# Patient Record
Sex: Male | Born: 1985 | Race: Black or African American | Hispanic: No | Marital: Single | Smoking: Former smoker
Health system: Southern US, Community
[De-identification: ages and names within clinical notes are randomized; demographics above are authoritative.]

## PROBLEM LIST (undated history)

## (undated) DIAGNOSIS — T7840XA Allergy, unspecified, initial encounter: Secondary | ICD-10-CM

## (undated) DIAGNOSIS — K219 Gastro-esophageal reflux disease without esophagitis: Secondary | ICD-10-CM

## (undated) DIAGNOSIS — I10 Essential (primary) hypertension: Secondary | ICD-10-CM

## (undated) DIAGNOSIS — I82409 Acute embolism and thrombosis of unspecified deep veins of unspecified lower extremity: Secondary | ICD-10-CM

## (undated) DIAGNOSIS — F4310 Post-traumatic stress disorder, unspecified: Secondary | ICD-10-CM

## (undated) HISTORY — DX: Acute embolism and thrombosis of unspecified deep veins of unspecified lower extremity: I82.409

## (undated) HISTORY — PX: WRIST SURGERY: SHX841

## (undated) HISTORY — DX: Allergy, unspecified, initial encounter: T78.40XA

## (undated) SURGERY — Surgical Case
Anesthesia: *Unknown

---

## 2009-08-14 ENCOUNTER — Emergency Department (HOSPITAL_COMMUNITY): Admission: EM | Admit: 2009-08-14 | Discharge: 2009-08-14 | Payer: Self-pay | Admitting: Emergency Medicine

## 2009-08-28 ENCOUNTER — Emergency Department (HOSPITAL_COMMUNITY): Admission: EM | Admit: 2009-08-28 | Discharge: 2009-08-28 | Payer: Self-pay | Admitting: Family Medicine

## 2011-06-20 ENCOUNTER — Emergency Department (HOSPITAL_COMMUNITY)
Admission: EM | Admit: 2011-06-20 | Discharge: 2011-06-20 | Disposition: A | Discharge status: Home / Self Care | Payer: Self-pay | Attending: Emergency Medicine | Admitting: Emergency Medicine

## 2011-06-20 ENCOUNTER — Emergency Department (HOSPITAL_COMMUNITY): Payer: Self-pay

## 2011-06-20 DIAGNOSIS — K219 Gastro-esophageal reflux disease without esophagitis: Secondary | ICD-10-CM | POA: Insufficient documentation

## 2011-06-20 DIAGNOSIS — R51 Headache: Secondary | ICD-10-CM | POA: Insufficient documentation

## 2011-06-20 DIAGNOSIS — R143 Flatulence: Secondary | ICD-10-CM | POA: Insufficient documentation

## 2011-06-20 DIAGNOSIS — R05 Cough: Secondary | ICD-10-CM | POA: Insufficient documentation

## 2011-06-20 DIAGNOSIS — R141 Gas pain: Secondary | ICD-10-CM | POA: Insufficient documentation

## 2011-06-20 DIAGNOSIS — R142 Eructation: Secondary | ICD-10-CM | POA: Insufficient documentation

## 2011-06-20 DIAGNOSIS — R059 Cough, unspecified: Secondary | ICD-10-CM | POA: Insufficient documentation

## 2011-06-20 DIAGNOSIS — R0789 Other chest pain: Secondary | ICD-10-CM | POA: Insufficient documentation

## 2011-08-03 ENCOUNTER — Ambulatory Visit (INDEPENDENT_AMBULATORY_CARE_PROVIDER_SITE_OTHER): Payer: Self-pay

## 2011-08-03 ENCOUNTER — Inpatient Hospital Stay (INDEPENDENT_AMBULATORY_CARE_PROVIDER_SITE_OTHER)
Admission: RE | Admit: 2011-08-03 | Discharge: 2011-08-03 | Disposition: A | Discharge status: Home / Self Care | Payer: Self-pay | Source: Ambulatory Visit | Attending: Family Medicine | Admitting: Family Medicine

## 2011-08-03 DIAGNOSIS — M79609 Pain in unspecified limb: Secondary | ICD-10-CM

## 2013-10-31 ENCOUNTER — Emergency Department (HOSPITAL_COMMUNITY): Payer: Self-pay

## 2013-10-31 ENCOUNTER — Emergency Department (HOSPITAL_COMMUNITY)
Admission: EM | Admit: 2013-10-31 | Discharge: 2013-10-31 | Discharge status: Absent without leave | Payer: Self-pay | Source: Home / Self Care

## 2013-10-31 ENCOUNTER — Emergency Department (HOSPITAL_COMMUNITY)
Admission: EM | Admit: 2013-10-31 | Discharge: 2013-10-31 | Disposition: A | Discharge status: Home / Self Care | Payer: Self-pay | Attending: Emergency Medicine | Admitting: Emergency Medicine

## 2013-10-31 ENCOUNTER — Encounter (HOSPITAL_COMMUNITY): Payer: Self-pay | Admitting: Emergency Medicine

## 2013-10-31 DIAGNOSIS — W230XXA Caught, crushed, jammed, or pinched between moving objects, initial encounter: Secondary | ICD-10-CM | POA: Insufficient documentation

## 2013-10-31 DIAGNOSIS — Y929 Unspecified place or not applicable: Secondary | ICD-10-CM | POA: Insufficient documentation

## 2013-10-31 DIAGNOSIS — Y939 Activity, unspecified: Secondary | ICD-10-CM | POA: Insufficient documentation

## 2013-10-31 DIAGNOSIS — S6991XA Unspecified injury of right wrist, hand and finger(s), initial encounter: Secondary | ICD-10-CM

## 2013-10-31 DIAGNOSIS — F17200 Nicotine dependence, unspecified, uncomplicated: Secondary | ICD-10-CM | POA: Insufficient documentation

## 2013-10-31 DIAGNOSIS — R209 Unspecified disturbances of skin sensation: Secondary | ICD-10-CM | POA: Insufficient documentation

## 2013-10-31 DIAGNOSIS — S6990XA Unspecified injury of unspecified wrist, hand and finger(s), initial encounter: Secondary | ICD-10-CM | POA: Insufficient documentation

## 2013-10-31 DIAGNOSIS — S6980XA Other specified injuries of unspecified wrist, hand and finger(s), initial encounter: Secondary | ICD-10-CM | POA: Insufficient documentation

## 2013-10-31 MED ORDER — NAPROXEN 250 MG PO TABS
500.0000 mg | ORAL_TABLET | Freq: Once | ORAL | Status: AC
  Administered 2013-10-31: 500 mg via ORAL
  Filled 2013-10-31: qty 2

## 2013-10-31 MED ORDER — TRAMADOL HCL 50 MG PO TABS: 50.0000 mg | ORAL_TABLET | Freq: Four times a day (QID) | ORAL | Status: DC | PRN

## 2013-10-31 MED ORDER — NAPROXEN 500 MG PO TABS: 500.0000 mg | ORAL_TABLET | Freq: Two times a day (BID) | ORAL | Status: DC

## 2013-10-31 NOTE — ED Notes (Addendum)
C/o R third digit pain and swelling.  Pt states he slammed it in a paper press around 1pm at work today.

## 2013-10-31 NOTE — Progress Notes (Signed)
Orthopedic Tech Progress Note Patient Details:  Jason Walter May 02, 1986 [id number]  Ortho Devices Type of Ortho Device: Finger splint Ortho Device/Splint Location: rue Ortho Device/Splint Interventions: Application   Nikki Dom 10/31/2013, 8:58 PM
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
## 2013-10-31 NOTE — ED Notes (Signed)
Pt requesting a different medication to take once he gets home, states, "The medicine did not help. I can take what she gave me during the day, but I need something stronger during the night. It is throbbing." PA made aware

## 2013-11-01 NOTE — ED Provider Notes (Signed)
Medical screening examination/treatment/procedure(s) were performed by non-physician practitioner and as supervising physician I was immediately available for consultation/collaboration.  EKG Interpretation   None        Cordarrell Sane R. Danielys Madry, MD 11/01/13 0023 

## 2014-06-04 ENCOUNTER — Encounter (HOSPITAL_COMMUNITY): Payer: Self-pay | Admitting: Emergency Medicine

## 2014-06-04 ENCOUNTER — Emergency Department (INDEPENDENT_AMBULATORY_CARE_PROVIDER_SITE_OTHER)
Admission: EM | Admit: 2014-06-04 | Discharge: 2014-06-04 | Disposition: A | Discharge status: Home / Self Care | Payer: Self-pay | Source: Home / Self Care

## 2014-06-04 DIAGNOSIS — T148XXA Other injury of unspecified body region, initial encounter: Secondary | ICD-10-CM

## 2014-06-04 DIAGNOSIS — Z041 Encounter for examination and observation following transport accident: Secondary | ICD-10-CM

## 2014-06-04 DIAGNOSIS — Z043 Encounter for examination and observation following other accident: Secondary | ICD-10-CM

## 2014-06-04 NOTE — ED Provider Notes (Signed)
Medical screening examination/treatment/procedure(s) were performed by non-physician practitioner and as supervising physician I was immediately available for consultation/collaboration.  Ashaunte Standley, M.D.  Fabien Travelstead C Yazaira Speas, MD 06/04/14 2032 
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
## 2014-06-04 NOTE — Discharge Instructions (Signed)
Ibuprofen as needed for discomfort.  Contusion A contusion is a deep bruise. Contusions are the result of an injury that caused bleeding under the skin. The contusion may turn blue, purple, or yellow. Minor injuries will give you a painless contusion, but more severe contusions may stay painful and swollen for a few weeks.  CAUSES  A contusion is usually caused by a blow, trauma, or direct force to an area of the body. SYMPTOMS   Swelling and redness of the injured area.  Bruising of the injured area.  Tenderness and soreness of the injured area.  Pain. DIAGNOSIS  The diagnosis can be made by taking a history and physical exam. An X-ray, CT scan, or MRI may be needed to determine if there were any associated injuries, such as fractures. TREATMENT  Specific treatment will depend on what area of the body was injured. In general, the best treatment for a contusion is resting, icing, elevating, and applying cold compresses to the injured area. Over-the-counter medicines may also be recommended for pain control. Ask your caregiver what the best treatment is for your contusion. HOME CARE INSTRUCTIONS   Put ice on the injured area.  Put ice in a plastic bag.  Place a towel between your skin and the bag.  Leave the ice on for 15-20 minutes, 3-4 times a day, or as directed by your health care provider.  Only take over-the-counter or prescription medicines for pain, discomfort, or fever as directed by your caregiver. Your caregiver may recommend avoiding anti-inflammatory medicines (aspirin, ibuprofen, and naproxen) for 48 hours because these medicines may increase bruising.  Rest the injured area.  If possible, elevate the injured area to reduce swelling. SEEK IMMEDIATE MEDICAL CARE IF:   You have increased bruising or swelling.  You have pain that is getting worse.  Your swelling or pain is not relieved with medicines. MAKE SURE YOU:   Understand these instructions.  Will watch  your condition.  Will get help right away if you are not doing well or get worse. Document Released: 08/25/2005 Document Revised: 11/20/2013 Document Reviewed: 09/20/2011 [facility] Patient Information 2015 [place], [place]. This information is not intended to replace advice given to you by your health care provider. Make sure you discuss any questions you have with your health care provider.

## 2014-08-06 ENCOUNTER — Emergency Department (HOSPITAL_COMMUNITY)
Admission: EM | Admit: 2014-08-06 | Discharge: 2014-08-06 | Discharge status: Against Medical Advice | Payer: Self-pay | Attending: Emergency Medicine | Admitting: Emergency Medicine

## 2014-08-06 ENCOUNTER — Encounter (HOSPITAL_COMMUNITY): Payer: Self-pay | Admitting: Emergency Medicine

## 2014-08-06 DIAGNOSIS — Y92410 Unspecified street and highway as the place of occurrence of the external cause: Secondary | ICD-10-CM | POA: Insufficient documentation

## 2014-08-06 DIAGNOSIS — F17200 Nicotine dependence, unspecified, uncomplicated: Secondary | ICD-10-CM | POA: Insufficient documentation

## 2014-08-06 DIAGNOSIS — M25512 Pain in left shoulder: Secondary | ICD-10-CM

## 2014-08-06 DIAGNOSIS — Z791 Long term (current) use of non-steroidal anti-inflammatories (NSAID): Secondary | ICD-10-CM | POA: Insufficient documentation

## 2014-08-06 DIAGNOSIS — S4980XA Other specified injuries of shoulder and upper arm, unspecified arm, initial encounter: Secondary | ICD-10-CM | POA: Insufficient documentation

## 2014-08-06 DIAGNOSIS — Y9389 Activity, other specified: Secondary | ICD-10-CM | POA: Insufficient documentation

## 2014-08-06 DIAGNOSIS — S46909A Unspecified injury of unspecified muscle, fascia and tendon at shoulder and upper arm level, unspecified arm, initial encounter: Secondary | ICD-10-CM | POA: Insufficient documentation
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
## 2014-08-06 NOTE — ED Notes (Signed)
Xray to room to take pt to xray, pt not in room.
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
## 2014-08-09 NOTE — ED Provider Notes (Signed)
Medical screening examination/treatment/procedure(s) were performed by non-physician practitioner and as supervising physician I was immediately available for consultation/collaboration.  Taro Hidrogo L Shawntavia Saunders, MD 08/09/14 0104 

## 2014-09-01 ENCOUNTER — Encounter (HOSPITAL_COMMUNITY): Payer: Self-pay | Admitting: Emergency Medicine

## 2014-09-01 ENCOUNTER — Emergency Department (HOSPITAL_COMMUNITY)
Admission: EM | Admit: 2014-09-01 | Discharge: 2014-09-02 | Disposition: A | Discharge status: Home / Self Care | Payer: No Typology Code available for payment source | Attending: Emergency Medicine | Admitting: Emergency Medicine

## 2014-09-01 DIAGNOSIS — M25511 Pain in right shoulder: Secondary | ICD-10-CM

## 2014-09-01 DIAGNOSIS — M545 Low back pain, unspecified: Secondary | ICD-10-CM

## 2014-09-01 DIAGNOSIS — S3992XA Unspecified injury of lower back, initial encounter: Secondary | ICD-10-CM | POA: Insufficient documentation

## 2014-09-01 DIAGNOSIS — Y9389 Activity, other specified: Secondary | ICD-10-CM | POA: Insufficient documentation

## 2014-09-01 DIAGNOSIS — Z791 Long term (current) use of non-steroidal anti-inflammatories (NSAID): Secondary | ICD-10-CM | POA: Insufficient documentation

## 2014-09-01 DIAGNOSIS — Y92410 Unspecified street and highway as the place of occurrence of the external cause: Secondary | ICD-10-CM | POA: Insufficient documentation

## 2014-09-01 DIAGNOSIS — Z79899 Other long term (current) drug therapy: Secondary | ICD-10-CM | POA: Insufficient documentation

## 2014-09-01 DIAGNOSIS — S4991XA Unspecified injury of right shoulder and upper arm, initial encounter: Secondary | ICD-10-CM | POA: Insufficient documentation

## 2014-09-01 DIAGNOSIS — Z720 Tobacco use: Secondary | ICD-10-CM | POA: Insufficient documentation

## 2014-09-01 MED ORDER — KETOROLAC TROMETHAMINE 60 MG/2ML IM SOLN
60.0000 mg | Freq: Once | INTRAMUSCULAR | Status: AC
  Administered 2014-09-02: 60 mg via INTRAMUSCULAR
  Filled 2014-09-01: qty 2
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
## 2014-09-02 ENCOUNTER — Emergency Department (HOSPITAL_COMMUNITY): Payer: No Typology Code available for payment source

## 2014-09-02 MED ORDER — METHOCARBAMOL 500 MG PO TABS: 500.0000 mg | ORAL_TABLET | Freq: Two times a day (BID) | ORAL | Status: DC

## 2014-09-02 MED ORDER — IBUPROFEN 800 MG PO TABS: 800.0000 mg | ORAL_TABLET | Freq: Three times a day (TID) | ORAL | Status: DC
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
## 2014-09-02 NOTE — ED Notes (Signed)
Pt returned from x-ray.  Pt remains alert and oriented x's 3.  Friend at bedside
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
## 2014-09-06 NOTE — ED Provider Notes (Signed)
Medical screening examination/treatment/procedure(s) were performed by non-physician practitioner and as supervising physician I was immediately available for consultation/collaboration.   EKG Interpretation None       Nthony Lefferts, MD 09/06/14 0102 

## 2016-06-04 ENCOUNTER — Encounter (HOSPITAL_COMMUNITY): Payer: Self-pay | Admitting: Emergency Medicine

## 2016-06-04 ENCOUNTER — Emergency Department (HOSPITAL_COMMUNITY)
Admission: EM | Admit: 2016-06-04 | Discharge: 2016-06-04 | Disposition: A | Discharge status: Home / Self Care | Payer: No Typology Code available for payment source | Attending: Emergency Medicine | Admitting: Emergency Medicine

## 2016-06-04 DIAGNOSIS — Y999 Unspecified external cause status: Secondary | ICD-10-CM | POA: Diagnosis not present

## 2016-06-04 DIAGNOSIS — F17200 Nicotine dependence, unspecified, uncomplicated: Secondary | ICD-10-CM | POA: Insufficient documentation

## 2016-06-04 DIAGNOSIS — Y92410 Unspecified street and highway as the place of occurrence of the external cause: Secondary | ICD-10-CM | POA: Insufficient documentation

## 2016-06-04 DIAGNOSIS — Y939 Activity, unspecified: Secondary | ICD-10-CM | POA: Diagnosis not present

## 2016-06-04 DIAGNOSIS — M545 Low back pain: Secondary | ICD-10-CM

## 2016-06-04 MED ORDER — NAPROXEN 250 MG PO TABS: 250.0000 mg | ORAL_TABLET | Freq: Two times a day (BID) | ORAL | Status: DC
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
## 2016-07-21 ENCOUNTER — Ambulatory Visit (HOSPITAL_COMMUNITY)
Admission: EM | Admit: 2016-07-21 | Discharge: 2016-07-21 | Disposition: A | Discharge status: Home / Self Care | Payer: Self-pay | Attending: Family Medicine | Admitting: Family Medicine

## 2016-07-21 ENCOUNTER — Encounter (HOSPITAL_COMMUNITY): Payer: Self-pay | Admitting: Emergency Medicine

## 2016-07-21 DIAGNOSIS — R300 Dysuria: Secondary | ICD-10-CM | POA: Insufficient documentation

## 2016-07-21 DIAGNOSIS — F17210 Nicotine dependence, cigarettes, uncomplicated: Secondary | ICD-10-CM | POA: Insufficient documentation

## 2016-07-21 DIAGNOSIS — R369 Urethral discharge, unspecified: Secondary | ICD-10-CM | POA: Insufficient documentation

## 2016-07-21 DIAGNOSIS — Z711 Person with feared health complaint in whom no diagnosis is made: Secondary | ICD-10-CM

## 2016-07-21 DIAGNOSIS — Z202 Contact with and (suspected) exposure to infections with a predominantly sexual mode of transmission: Secondary | ICD-10-CM

## 2016-07-21 NOTE — ED Provider Notes (Signed)
CSN: [id number]     Arrival date & time 07/21/16  [id number] History   None    Chief Complaint  Patient presents with  . SEXUALLY TRANSMITTED DISEASE   (Consider location/radiation/quality/duration/timing/severity/associated sxs/prior Treatment) HPI 30 year old male intercourse with a new male approximately one half weeks ago now has a milky   White discharge from his penis. He also has dysuria with urination. No previous history of STD. No pain at this time. No home treatment. Although patient did state he looked on the Internet and was going to try a vinegar cleansing. History reviewed. No pertinent past medical history. History reviewed. No pertinent surgical history. History reviewed. No pertinent family history. Social History  Substance Use Topics  . Smoking status: Current Every Day Smoker    Packs/day: 0.10  . Smokeless tobacco: Never Used  . Alcohol use Yes     Comment: occasional    Review of Systems  Denies: HEADACHE, NAUSEA, ABDOMINAL PAIN, CHEST PAIN, CONGESTION,  SHORTNESS OF BREATH  Allergies  Review of patient's allergies indicates no known allergies.  Home Medications   Prior to Admission medications   Medication Sig Start Date End Date Taking? Authorizing Provider  ibuprofen (ADVIL,MOTRIN) 800 MG tablet Take 1 tablet (800 mg total) by mouth 3 (three) times daily. 09/02/14   Hannah Muthersbaugh, PA-C  methocarbamol (ROBAXIN) 500 MG tablet Take 1 tablet (500 mg total) by mouth 2 (two) times daily. 09/02/14   Hannah Muthersbaugh, PA-C  naproxen (NAPROSYN) 250 MG tablet Take 1 tablet (250 mg total) by mouth 2 (two) times daily with a meal. 06/04/16   Everlene FarrierWilliam Dansie, PA-C   Meds Ordered and Administered this Visit  Medications - No data to display  BP (!) 148/103 (BP Location: Right Arm)   Pulse 115   Temp 98.8 F (37.1 C) (Oral)   SpO2 100%  No data found.   Physical Exam NURSES NOTES AND VITAL SIGNS REVIEWED. CONSTITUTIONAL: Well developed, well nourished, no  acute distress HEENT: normocephalic, atraumatic EYES: Conjunctiva normal NECK:normal ROM, supple, no adenopathy PULMONARY:No respiratory distress, normal effort ABDOMINAL: Soft, ND, NT BS+, No CVAT MUSCULOSKELETAL: Normal ROM of all extremities,  SKIN: warm and dry without rash PSYCHIATRIC: Mood and affect, behavior are normal GENITALIA: NORMAL EXTERNAL MALE PENIS AND SCROTUM.  NO VISIBLE OR PALPABLE LESIONS.   Urgent Care Course   Clinical Course    Procedures (including critical care time)  Labs Review Labs Reviewed  URINE CYTOLOGY ANCILLARY ONLY   Tests are pending Imaging Review No results found.   Visual Acuity Review  Right Eye Distance:   Left Eye Distance:   Bilateral Distance:    Right Eye Near:   Left Eye Near:    Bilateral Near:         30 year old male with concerns of possible STD. Cultures are pending patient declines HIV because of cost.   As far as been under cleansing I advised patient that he can proceed with this if he so desires but if it turns positive on his STD cultures he will need to be treated per the rules of the public health department. MDM   1. Concern about STD in male without diagnosis     Patient is reassured that there are no issues that require transfer to higher level of care at this time or additional tests. Patient is advised to continue home symptomatic treatment. Patient is advised that if there are new or worsening symptoms to attend the emergency department, contact primary care provider, or  return to UC. Instructions of care provided discharged home in stable condition.    THIS NOTE WAS GENERATED USING A VOICE RECOGNITION SOFTWARE PROGRAM. ALL REASONABLE EFFORTS  WERE MADE TO PROOFREAD THIS DOCUMENT FOR ACCURACY.  I have verbally reviewed the discharge instructions with the patient. A printed AVS was given to the patient.  All questions were answered prior to discharge.      Tharon AquasFrank C Tashona Calk, PA 07/21/16 1144

## 2016-07-21 NOTE — ED Triage Notes (Signed)
Pt reports burning with urination and some white discharge for the last week.  Pt has had unprotected sex in the recent past.

## 2016-07-22 LAB — URINE CYTOLOGY ANCILLARY ONLY
CHLAMYDIA, DNA PROBE: POSITIVE — AB
Neisseria Gonorrhea: NEGATIVE

## 2016-07-23 ENCOUNTER — Telehealth (HOSPITAL_COMMUNITY): Payer: Self-pay | Admitting: Internal Medicine

## 2016-07-23 MED ORDER — AZITHROMYCIN 500 MG PO TABS: 1000.0000 mg | ORAL_TABLET | Freq: Once | ORAL | 0 refills | Status: AC
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
## 2016-07-28 NOTE — Telephone Encounter (Signed)
See lab note from 8/23

## 2016-09-17 ENCOUNTER — Ambulatory Visit (HOSPITAL_COMMUNITY)
Admission: EM | Admit: 2016-09-17 | Discharge: 2016-09-17 | Disposition: A | Discharge status: Home / Self Care | Payer: Self-pay | Attending: Family Medicine | Admitting: Family Medicine

## 2016-09-17 ENCOUNTER — Encounter (HOSPITAL_COMMUNITY): Payer: Self-pay | Admitting: Family Medicine

## 2016-09-17 DIAGNOSIS — N342 Other urethritis: Secondary | ICD-10-CM | POA: Insufficient documentation

## 2016-09-17 DIAGNOSIS — Z202 Contact with and (suspected) exposure to infections with a predominantly sexual mode of transmission: Secondary | ICD-10-CM | POA: Insufficient documentation

## 2016-09-17 DIAGNOSIS — F17210 Nicotine dependence, cigarettes, uncomplicated: Secondary | ICD-10-CM | POA: Insufficient documentation

## 2016-09-17 MED ORDER — AZITHROMYCIN 250 MG PO TABS
ORAL_TABLET | ORAL | Status: AC
  Filled 2016-09-17: qty 4

## 2016-09-17 MED ORDER — CEFTRIAXONE SODIUM 250 MG IJ SOLR
250.0000 mg | Freq: Once | INTRAMUSCULAR | Status: AC
  Administered 2016-09-17: 250 mg via INTRAMUSCULAR

## 2016-09-17 MED ORDER — LIDOCAINE HCL (PF) 1 % IJ SOLN
INTRAMUSCULAR | Status: AC
  Filled 2016-09-17: qty 2

## 2016-09-17 MED ORDER — CEFTRIAXONE SODIUM 250 MG IJ SOLR
INTRAMUSCULAR | Status: AC
  Filled 2016-09-17: qty 250

## 2016-09-17 MED ORDER — AZITHROMYCIN 250 MG PO TABS
1000.0000 mg | ORAL_TABLET | Freq: Once | ORAL | Status: AC
  Administered 2016-09-17: 1000 mg via ORAL

## 2016-09-17 NOTE — ED Notes (Signed)
Call back number verified and updated in EPIC... Adv pt to not have SI until lab results comeback neg.... Also adv pt lab results will be on MyChart; instructions given .... Pt verb understanding.   

## 2016-09-17 NOTE — ED Triage Notes (Signed)
Pt here for penile dc onset 2 weeks  Reports he was pos for Chlam and was given azithromycin but not sure if took them correctly  Denies exposure to STDs today, denies urinary sx, fevers, chills... Just wants to make sure he doesn't have Chlam  A&O x4... NAD

## 2016-09-17 NOTE — ED Provider Notes (Signed)
[facility]    CSN: [id number] Arrival date & time: 09/17/16  1703     History   Chief Complaint Chief Complaint  Patient presents with  . Exposure to STD    HPI Jason Walter is a 30 y.o. male.   This is 4 year old man was last seen in August and treated for STD. He says he never got completely better and they still has penile discharge. He can't remember if he took the medicine as prescribed.  He has no joint pain or fever.      History reviewed. No pertinent past medical history.  There are no active problems to display for this patient.   History reviewed. No pertinent surgical history.     Home Medications    Prior to Admission medications   Medication Sig Start Date End Date Taking? Authorizing Provider  ibuprofen (ADVIL,MOTRIN) 800 MG tablet Take 1 tablet (800 mg total) by mouth 3 (three) times daily. 09/02/14   Hannah Muthersbaugh, PA-C  methocarbamol (ROBAXIN) 500 MG tablet Take 1 tablet (500 mg total) by mouth 2 (two) times daily. 09/02/14   Hannah Muthersbaugh, PA-C  naproxen (NAPROSYN) 250 MG tablet Take 1 tablet (250 mg total) by mouth 2 (two) times daily with a meal. 06/04/16   Everlene Farrier, PA-C    Family History History reviewed. No pertinent family history.  Social History Social History  Substance Use Topics  . Smoking status: Current Every Day Smoker    Packs/day: 0.10  . Smokeless tobacco: Never Used  . Alcohol use Yes     Comment: occasional     Allergies   Review of patient's allergies indicates no known allergies.   Review of Systems Review of Systems  Constitutional: Negative.   HENT: Negative.   Genitourinary: Positive for discharge.  Musculoskeletal: Negative.      Physical Exam Triage Vital Signs ED Triage Vitals [09/17/16 1707]  Enc Vitals Group     BP 135/67     Pulse Rate 115     Resp 14     Temp 98.9 F (37.2 C)     Temp Source Oral     SpO2 100 %     Weight      Height      Head  Circumference      Peak Flow      Pain Score      Pain Loc      Pain Edu?      Excl. in GC?    No data found.   Updated Vital Signs BP 135/67 (BP Location: Left Arm)   Pulse 115   Temp 98.9 F (37.2 C) (Oral)   Resp 14   SpO2 100%      Physical Exam  Constitutional: He is oriented to person, place, and time. He appears well-developed and well-nourished.  HENT:  Head: Normocephalic.  Eyes: Conjunctivae are normal. Pupils are equal, round, and reactive to light.  Neck: Normal range of motion. Neck supple.  Pulmonary/Chest: Effort normal.  Abdominal: Soft.  Genitourinary: Penis normal.  Neurological: He is alert and oriented to person, place, and time.  Skin: Skin is warm and dry.  Nursing note and vitals reviewed.  Penis:  Normal with scant watery discharge Genitalia otherwise normal  UC Treatments / Results  Labs (all labs ordered are listed, but only abnormal results are displayed) Labs Reviewed  URINE CYTOLOGY ANCILLARY ONLY    EKG  EKG Interpretation None       Radiology No  results found.  Procedures Procedures (including critical care time)  Medications Ordered in UC Medications  cefTRIAXone (ROCEPHIN) injection 250 mg (250 mg Intramuscular Given 09/17/16 1747)  azithromycin (ZITHROMAX) tablet 1,000 mg (1,000 mg Oral Given 09/17/16 1748)     Initial Impression / Assessment and Plan / UC Course  I have reviewed the triage vital signs and the nursing notes.  Pertinent labs & imaging results that were available during my care of the patient were reviewed by me and considered in my medical decision making (see chart for details).  Clinical Course    Final Clinical Impressions(s) / UC Diagnoses   Final diagnoses:  Urethritis  STD exposure    New Prescriptions Discharge Medication List as of 09/17/2016  5:37 PM    Rocephin 250 mg IM Azithromycin 1 g by mouth   Elvina SidleKurt Paw Karstens, MD 09/20/16 1258

## 2016-09-20 LAB — URINE CYTOLOGY ANCILLARY ONLY
Chlamydia: POSITIVE — AB
Neisseria Gonorrhea: NEGATIVE

## 2016-09-21 ENCOUNTER — Telehealth (HOSPITAL_COMMUNITY): Payer: Self-pay | Admitting: Emergency Medicine

## 2016-09-21 NOTE — Telephone Encounter (Signed)
-----   Message from Eustace MooreLaura W Murray, MD sent at 09/20/2016  5:24 PM EDT ----- Please let patient and health department know that test for chlamydia was positive.   [facility] 1[facility].   Sexual partners need to be notified and tested/treated.   Recheck for further evaluation if symptoms persist.  LM

## 2016-09-21 NOTE — Telephone Encounter (Signed)
Called [phone]; no answer or VM Need to give lab results and to see how pt is doing from recent visit on 09/17/16 [facility]

## 2016-09-29 NOTE — Telephone Encounter (Signed)
LM on [phone]; Called [phone] but it was busy Mailed letter as 4th attempt Need to give lab results and to see how pt is doing from recent visit on 10/20 Also let pt know labs can be obtained from MyChart

## 2017-08-31 ENCOUNTER — Ambulatory Visit (HOSPITAL_COMMUNITY)
Admission: EM | Admit: 2017-08-31 | Discharge: 2017-08-31 | Disposition: A | Discharge status: Home / Self Care | Payer: Self-pay | Attending: Family Medicine | Admitting: Family Medicine

## 2017-08-31 ENCOUNTER — Encounter (HOSPITAL_COMMUNITY): Payer: Self-pay | Admitting: Emergency Medicine

## 2017-08-31 ENCOUNTER — Ambulatory Visit (INDEPENDENT_AMBULATORY_CARE_PROVIDER_SITE_OTHER): Payer: Self-pay

## 2017-08-31 DIAGNOSIS — M25532 Pain in left wrist: Secondary | ICD-10-CM
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
## 2018-08-15 ENCOUNTER — Ambulatory Visit (HOSPITAL_COMMUNITY)
Admission: EM | Admit: 2018-08-15 | Discharge: 2018-08-15 | Disposition: A | Discharge status: Home / Self Care | Payer: Self-pay | Attending: Family Medicine | Admitting: Family Medicine

## 2018-08-15 ENCOUNTER — Other Ambulatory Visit: Payer: Self-pay

## 2018-08-15 ENCOUNTER — Encounter (HOSPITAL_COMMUNITY): Payer: Self-pay | Admitting: Emergency Medicine

## 2018-08-15 DIAGNOSIS — M25571 Pain in right ankle and joints of right foot: Secondary | ICD-10-CM

## 2018-08-15 DIAGNOSIS — M545 Low back pain, unspecified: Secondary | ICD-10-CM

## 2018-08-15 MED ORDER — CYCLOBENZAPRINE HCL 10 MG PO TABS: 10.0000 mg | ORAL_TABLET | Freq: Two times a day (BID) | ORAL | 0 refills | Status: DC | PRN

## 2018-08-15 MED ORDER — IBUPROFEN 800 MG PO TABS: 800.0000 mg | ORAL_TABLET | Freq: Three times a day (TID) | ORAL | 0 refills | Status: DC
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
## 2018-08-15 NOTE — Discharge Instructions (Signed)
I expect pain to gradually improve over the next 1 to 2 weeks from here  Use anti-inflammatories for pain/swelling. You may take up to 800 mg Ibuprofen every 8 hours with food. You may supplement Ibuprofen with Tylenol [phone] mg every 8 hours.   You may use flexeril as needed to help with pain. This is a muscle relaxer and causes sedation- please use only at bedtime or when you will be home and not have to drive/work- may begin with 1/2 tablet  Alternate ice and heat to help with discomfort  Please follow-up if pain worsening, developing numbness or tingling, changes in bowel movements
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
## 2018-09-08 ENCOUNTER — Encounter (HOSPITAL_COMMUNITY): Payer: Self-pay | Admitting: Emergency Medicine

## 2018-09-08 ENCOUNTER — Ambulatory Visit (HOSPITAL_COMMUNITY)
Admission: EM | Admit: 2018-09-08 | Discharge: 2018-09-08 | Disposition: A | Discharge status: Home / Self Care | Payer: Self-pay | Attending: Family Medicine | Admitting: Family Medicine

## 2018-09-08 DIAGNOSIS — R319 Hematuria, unspecified: Secondary | ICD-10-CM | POA: Insufficient documentation

## 2018-09-08 DIAGNOSIS — F17210 Nicotine dependence, cigarettes, uncomplicated: Secondary | ICD-10-CM | POA: Insufficient documentation

## 2018-09-08 DIAGNOSIS — G4700 Insomnia, unspecified: Secondary | ICD-10-CM | POA: Insufficient documentation

## 2018-09-08 LAB — POCT URINALYSIS DIP (DEVICE)
BILIRUBIN URINE: NEGATIVE
Glucose, UA: NEGATIVE mg/dL
Ketones, ur: NEGATIVE mg/dL
LEUKOCYTES UA: NEGATIVE
NITRITE: NEGATIVE
PH: 6.5 (ref 5.0–8.0)
Protein, ur: NEGATIVE mg/dL
Specific Gravity, Urine: 1.02 (ref 1.005–1.030)
Urobilinogen, UA: 0.2 mg/dL (ref 0.0–1.0)

## 2018-09-08 MED ORDER — DOXYCYCLINE HYCLATE 100 MG PO TABS: 100.0000 mg | ORAL_TABLET | Freq: Two times a day (BID) | ORAL | 0 refills | Status: DC

## 2018-09-08 NOTE — ED Triage Notes (Signed)
Pt here after noting blood in urine last night

## 2018-09-08 NOTE — Discharge Instructions (Signed)
Melatonin 2 mg or up to 5 mg taken before bedtime will help with insomnia.  Please return in 1 week so I can check the urine and make sure all of the blood has cleared.

## 2018-09-08 NOTE — ED Provider Notes (Signed)
[facility]    CSN: [id number] Arrival date & time: 09/08/18  1939     History   Chief Complaint Chief Complaint  Patient presents with  . Hematuria    HPI Jason Walter is a 32 y.o. male.   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  He comes in complaining about hematuria.  This began last night.  Patient had no fever, flank pain, or significant dysuria.  He has had no discharge.  Patient also notes a small pimple on the right scrotum which he manipulated and thought might have caused the bleeding from his penis.  Patient has a history of chlamydia     History reviewed. No pertinent past medical history.  There are no active problems to display for this patient.   History reviewed. No pertinent surgical history.     Home Medications    Prior to Admission medications   Medication Sig Start Date End Date Taking? Authorizing Provider  doxycycline (VIBRA-TABS) 100 MG tablet Take 1 tablet (100 mg total) by mouth 2 (two) times daily. 09/08/18   Elvina Sidle, MD    Family History History reviewed. No pertinent family history.  Social History Social History   Tobacco Use  . Smoking status: Current Every Day Smoker    Packs/day: 0.10  . Smokeless tobacco: Never Used  Substance Use Topics  . Alcohol use: Yes    Comment: occasional  . Drug use: No     Allergies   Patient has no known allergies.   Review of Systems Review of Systems  Genitourinary: Positive for genital sores and hematuria.  Skin: Positive for rash.  All other systems reviewed and are negative.    Physical Exam Triage Vital Signs ED Triage Vitals [09/08/18 1955]  Enc Vitals Group     BP (!) 157/87     Pulse Rate 73     Resp 18     Temp 97.9 F (36.6 C)     Temp Source Oral     SpO2 100 %     Weight      Height      Head Circumference      Peak Flow      Pain Score 0     Pain Loc      Pain Edu?      Excl. in GC?    No  data found.  Updated Vital Signs BP (!) 157/87 (BP Location: Left Arm)   Pulse 73   Temp 97.9 F (36.6 C) (Oral)   Resp 18   SpO2 100%    Physical Exam  Constitutional: He is oriented to person, place, and time. He appears well-developed and well-nourished.  HENT:  Right Ear: External ear normal.  Left Ear: External ear normal.  Mouth/Throat: Oropharynx is clear and moist.  Eyes: Pupils are equal, round, and reactive to light. Conjunctivae are normal.  Neck: Normal range of motion. Neck supple.  Pulmonary/Chest: Effort normal.  Genitourinary:  Genitourinary Comments: Small drop of bright red blood seen in urethral meatus  Musculoskeletal: Normal range of motion.  Neurological: He is alert and oriented to person, place, and time.  Skin: Skin is warm and dry.  Nursing note and vitals reviewed.    UC Treatments / Results  Labs (all labs ordered are listed, but only abnormal results are displayed) Labs Reviewed  POCT URINALYSIS DIP (DEVICE) - Abnormal; Notable for the following components:      Result Value  Hgb urine dipstick LARGE (*)    All other components within normal limits  URINE CULTURE  URINE CYTOLOGY ANCILLARY ONLY    EKG None  Radiology No results found.  Procedures Procedures (including critical care time)  Medications Ordered in UC Medications - No data to display  Initial Impression / Assessment and Plan / UC Course  I have reviewed the triage vital signs and the nursing notes.  Pertinent labs & imaging results that were available during my care of the patient were reviewed by me and considered in my medical decision making (see chart for details).     Final Clinical Impressions(s) / UC Diagnoses   Final diagnoses:  Hematuria, unspecified type  Insomnia, unspecified type     Discharge Instructions     Melatonin 2 mg or up to 5 mg taken before bedtime will help with insomnia.  Please return in 1 week so I can check the urine and make  sure all of the blood has cleared.    ED Prescriptions    Medication Sig Dispense Auth. Provider   doxycycline (VIBRA-TABS) 100 MG tablet Take 1 tablet (100 mg total) by mouth 2 (two) times daily. 20 tablet Elvina Sidle, MD     Controlled Substance Prescriptions [place] Controlled Substance Registry consulted? Not Applicable   Elvina Sidle, MD 09/08/18 2052

## 2018-09-10 LAB — URINE CULTURE: Culture: <10000 — AB

## 2018-09-11 LAB — URINE CYTOLOGY ANCILLARY ONLY
Chlamydia: NEGATIVE
Neisseria Gonorrhea: NEGATIVE
Trichomonas: NEGATIVE

## 2018-12-20 ENCOUNTER — Encounter (HOSPITAL_COMMUNITY): Payer: Self-pay | Admitting: Emergency Medicine

## 2018-12-20 ENCOUNTER — Ambulatory Visit (HOSPITAL_COMMUNITY)
Admission: EM | Admit: 2018-12-20 | Discharge: 2018-12-20 | Disposition: A | Discharge status: Home / Self Care | Payer: 59 | Attending: Family Medicine | Admitting: Family Medicine

## 2018-12-20 DIAGNOSIS — S41112A Laceration without foreign body of left upper arm, initial encounter: Secondary | ICD-10-CM

## 2018-12-20 DIAGNOSIS — W260XXA Contact with knife, initial encounter: Secondary | ICD-10-CM | POA: Diagnosis not present

## 2018-12-20 DIAGNOSIS — W01198A Fall on same level from slipping, tripping and stumbling with subsequent striking against other object, initial encounter: Secondary | ICD-10-CM

## 2018-12-20 DIAGNOSIS — Z23 Encounter for immunization: Secondary | ICD-10-CM | POA: Diagnosis not present

## 2018-12-20 MED ORDER — TETANUS-DIPHTH-ACELL PERTUSSIS 5-2.5-18.5 LF-MCG/0.5 IM SUSP
INTRAMUSCULAR | Status: AC
  Filled 2018-12-20: qty 0.5

## 2018-12-20 MED ORDER — TETANUS-DIPHTH-ACELL PERTUSSIS 5-2.5-18.5 LF-MCG/0.5 IM SUSP
0.5000 mL | Freq: Once | INTRAMUSCULAR | Status: AC
  Administered 2018-12-20: 0.5 mL via INTRAMUSCULAR

## 2018-12-20 NOTE — ED Triage Notes (Signed)
Pt presents to [facility] for assessment after "falling on a knife" in his kitchen.

## 2018-12-21 NOTE — ED Provider Notes (Signed)
[facility]   [id number] 12/20/18 Arrival Time: 1920  ASSESSMENT & PLAN:  1. Arm laceration, left, initial encounter    Meds ordered this encounter  Medications  . Tdap (BOOSTRIX) injection 0.5 mL   Procedure: Verbal consent obtained. Patient provided with risks and alternatives to the procedure. Wound copiously irrigated with NS then cleansed with betadine. Anesthetized with 4 mL of lidocaine without epinephrine. Wound carefully explored. No foreign body or nonviable tissue noted. Using sterile technique #4 interrupted 4-0 Ethilon sutures were placed to reapproximate the wound. Patient tolerated procedure well. No complications. Minimal bleeding. Patient advised to look for and return for any signs of infection such as redness, swelling, discharge, or worsening pain. Return for suture removal in 7-10 days.  See AVS for written instructions. OTC analgesics if needed.  Reviewed expectations re: course of current medical issues. Questions answered. Outlined signs and symptoms indicating need for more acute intervention. Patient verbalized understanding. After Visit Summary given.   SUBJECTIVE:  Jason Walter is a 33 y.o. male who presents with a laceration of his L upper arm. Reports slipping in kitchen, knocking a knife off the counter, and falling to the floor where the knife cut his upper arm. Moderate bleeding now controlled. No extremity sensation changes or weakness. No ROM trouble of LUE.  Td UTD: No.  ROS: As per HPI.   OBJECTIVE:  Vitals:   12/20/18 1936  BP: (!) 143/67  Pulse: 98  Resp: 18  Temp: (!) 97.1 F (36.2 C)  TempSrc: Oral  SpO2: 99%    General appearance: alert; no distress Skin: linear laceration of L upper arm; size: approx 1.5 cm; clean wound edges, no foreign bodies; without active bleeding; normal distal sensation LUE: normal ROM and strength Psychological: alert and cooperative; normal mood and affect   Labs Reviewed - No data to  display  No results found.  No Known Allergies   Social History   Socioeconomic History  . Marital status: Legally Separated    Spouse name: Not on file  . Number of children: Not on file  . Years of education: Not on file  . Highest education level: Not on file  Occupational History  . Not on file  Social Needs  . Financial resource strain: Not on file  . Food insecurity:    Worry: Not on file    Inability: Not on file  . Transportation needs:    Medical: Not on file    Non-medical: Not on file  Tobacco Use  . Smoking status: Current Every Day Smoker    Packs/day: 0.10  . Smokeless tobacco: Never Used  Substance and Sexual Activity  . Alcohol use: Yes    Comment: occasional  . Drug use: No  . Sexual activity: Yes    Birth control/protection: None  Lifestyle  . Physical activity:    Days per week: Not on file    Minutes per session: Not on file  . Stress: Not on file  Relationships  . Social connections:    Talks on phone: Not on file    Gets together: Not on file    Attends religious service: Not on file    Active member of club or organization: Not on file    Attends meetings of clubs or organizations: Not on file    Relationship status: Not on file  Other Topics Concern  . Not on file  Social History Narrative  . Not on file  Mardella Layman, MD 12/26/18 [phone]

## 2019-02-13 ENCOUNTER — Emergency Department (HOSPITAL_COMMUNITY): Payer: 59

## 2019-02-13 ENCOUNTER — Emergency Department (HOSPITAL_COMMUNITY)
Admission: EM | Admit: 2019-02-13 | Discharge: 2019-02-13 | Disposition: A | Discharge status: Home / Self Care | Payer: 59 | Attending: Emergency Medicine | Admitting: Emergency Medicine

## 2019-02-13 DIAGNOSIS — Z79899 Other long term (current) drug therapy: Secondary | ICD-10-CM | POA: Insufficient documentation

## 2019-02-13 DIAGNOSIS — F17210 Nicotine dependence, cigarettes, uncomplicated: Secondary | ICD-10-CM | POA: Insufficient documentation

## 2019-02-13 DIAGNOSIS — R0789 Other chest pain: Secondary | ICD-10-CM | POA: Diagnosis present

## 2019-02-13 MED ORDER — IBUPROFEN 800 MG PO TABS
800.0000 mg | ORAL_TABLET | Freq: Once | ORAL | Status: AC
  Administered 2019-02-13: 800 mg via ORAL
  Filled 2019-02-13: qty 1

## 2019-02-13 MED ORDER — IBUPROFEN 800 MG PO TABS: 800.0000 mg | ORAL_TABLET | Freq: Three times a day (TID) | ORAL | 0 refills | Status: DC
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
## 2019-02-13 NOTE — Discharge Instructions (Addendum)
Cold compresses to chest wall for 20 minutes at a time.  Take Motrin as needed as directed.  X-rays are reassuring, no obvious rib fractures.

## 2019-02-15 ENCOUNTER — Encounter (HOSPITAL_COMMUNITY): Payer: Self-pay | Admitting: Emergency Medicine

## 2019-02-15 ENCOUNTER — Ambulatory Visit (HOSPITAL_COMMUNITY)
Admission: EM | Admit: 2019-02-15 | Discharge: 2019-02-15 | Disposition: A | Discharge status: Home / Self Care | Payer: 59 | Attending: Family Medicine | Admitting: Family Medicine

## 2019-02-15 ENCOUNTER — Other Ambulatory Visit: Payer: Self-pay

## 2019-02-15 DIAGNOSIS — R0782 Intercostal pain: Secondary | ICD-10-CM | POA: Diagnosis not present

## 2019-02-15 DIAGNOSIS — S20211A Contusion of right front wall of thorax, initial encounter: Secondary | ICD-10-CM

## 2019-02-15 MED ORDER — HYDROCODONE-ACETAMINOPHEN 5-325 MG PO TABS: 1.0000 | ORAL_TABLET | Freq: Four times a day (QID) | ORAL | 0 refills | Status: DC | PRN
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
## 2019-02-15 NOTE — ED Notes (Signed)
Pt given IS to take home.  Pt instructed on use and how often.  Pt returned demonstration and stated understanding.
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
## 2019-02-15 NOTE — Discharge Instructions (Signed)
Be aware, pain medications may cause drowsiness. Please do not drive, operate heavy machinery or make important decisions while on this medication, it can cloud your judgement.  

## 2020-05-29 ENCOUNTER — Emergency Department (HOSPITAL_COMMUNITY): Payer: No Typology Code available for payment source

## 2020-05-29 ENCOUNTER — Other Ambulatory Visit: Payer: Self-pay

## 2020-05-29 ENCOUNTER — Inpatient Hospital Stay (HOSPITAL_COMMUNITY)
Admission: EM | Admit: 2020-05-29 | Discharge: 2020-06-03 | DRG: 516 | Disposition: A | Discharge status: Home / Self Care | Payer: No Typology Code available for payment source | Attending: Physician Assistant | Admitting: Physician Assistant

## 2020-05-29 DIAGNOSIS — S7401XA Injury of sciatic nerve at hip and thigh level, right leg, initial encounter: Secondary | ICD-10-CM

## 2020-05-29 DIAGNOSIS — S32401A Unspecified fracture of right acetabulum, initial encounter for closed fracture: Secondary | ICD-10-CM

## 2020-05-29 DIAGNOSIS — Z419 Encounter for procedure for purposes other than remedying health state, unspecified: Secondary | ICD-10-CM

## 2020-05-29 DIAGNOSIS — Z6836 Body mass index (BMI) 36.0-36.9, adult: Secondary | ICD-10-CM

## 2020-05-29 DIAGNOSIS — Y92410 Unspecified street and highway as the place of occurrence of the external cause: Secondary | ICD-10-CM

## 2020-05-29 DIAGNOSIS — S32038A Other fracture of third lumbar vertebra, initial encounter for closed fracture: Secondary | ICD-10-CM | POA: Diagnosis present

## 2020-05-29 DIAGNOSIS — F17210 Nicotine dependence, cigarettes, uncomplicated: Secondary | ICD-10-CM | POA: Diagnosis present

## 2020-05-29 DIAGNOSIS — Z7289 Other problems related to lifestyle: Secondary | ICD-10-CM

## 2020-05-29 DIAGNOSIS — S32409A Unspecified fracture of unspecified acetabulum, initial encounter for closed fracture: Secondary | ICD-10-CM

## 2020-05-29 DIAGNOSIS — S32461A Displaced associated transverse-posterior fracture of right acetabulum, initial encounter for closed fracture: Secondary | ICD-10-CM | POA: Diagnosis not present

## 2020-05-29 DIAGNOSIS — M25561 Pain in right knee: Secondary | ICD-10-CM | POA: Diagnosis present

## 2020-05-29 DIAGNOSIS — K5900 Constipation, unspecified: Secondary | ICD-10-CM | POA: Diagnosis not present

## 2020-05-29 DIAGNOSIS — S73014A Posterior dislocation of right hip, initial encounter: Secondary | ICD-10-CM | POA: Diagnosis present

## 2020-05-29 DIAGNOSIS — G589 Mononeuropathy, unspecified: Secondary | ICD-10-CM | POA: Diagnosis present

## 2020-05-29 DIAGNOSIS — Z79899 Other long term (current) drug therapy: Secondary | ICD-10-CM

## 2020-05-29 DIAGNOSIS — T1490XA Injury, unspecified, initial encounter: Secondary | ICD-10-CM

## 2020-05-29 DIAGNOSIS — E669 Obesity, unspecified: Secondary | ICD-10-CM | POA: Diagnosis present

## 2020-05-29 DIAGNOSIS — D62 Acute posthemorrhagic anemia: Secondary | ICD-10-CM | POA: Diagnosis not present

## 2020-05-29 DIAGNOSIS — Z20822 Contact with and (suspected) exposure to covid-19: Secondary | ICD-10-CM | POA: Diagnosis present

## 2020-05-29 DIAGNOSIS — R000 Tachycardia, unspecified: Secondary | ICD-10-CM | POA: Diagnosis not present

## 2020-05-29 DIAGNOSIS — S73004A Unspecified dislocation of right hip, initial encounter: Secondary | ICD-10-CM

## 2020-05-29 LAB — PROTIME-INR
INR: 1 (ref 0.8–1.2)
Prothrombin Time: 12.2 seconds (ref 11.4–15.2)

## 2020-05-29 LAB — LACTIC ACID, PLASMA: Lactic Acid, Venous: 2.7 mmol/L (ref 0.5–1.9)

## 2020-05-29 LAB — CBC WITH DIFFERENTIAL/PLATELET
Abs Immature Granulocytes: 0.1 10*3/uL — ABNORMAL HIGH (ref 0.00–0.07)
Basophils Absolute: 0.1 10*3/uL (ref 0.0–0.1)
Basophils Relative: 1 %
Eosinophils Absolute: 0.1 10*3/uL (ref 0.0–0.5)
Eosinophils Relative: 1 %
HCT: 44 % (ref 39.0–52.0)
Hemoglobin: 14.4 g/dL (ref 13.0–17.0)
Immature Granulocytes: 1 %
Lymphocytes Relative: 35 %
Lymphs Abs: 3.3 10*3/uL (ref 0.7–4.0)
MCH: 30.1 pg (ref 26.0–34.0)
MCHC: 32.7 g/dL (ref 30.0–36.0)
MCV: 92.1 fL (ref 80.0–100.0)
Monocytes Absolute: 0.7 10*3/uL (ref 0.1–1.0)
Monocytes Relative: 7 %
Neutro Abs: 5.1 10*3/uL (ref 1.7–7.7)
Neutrophils Relative %: 55 %
Platelets: 264 10*3/uL (ref 150–400)
RBC: 4.78 MIL/uL (ref 4.22–5.81)
RDW: 12.4 % (ref 11.5–15.5)
WBC: 9.3 10*3/uL (ref 4.0–10.5)
nRBC: 0 % (ref 0.0–0.2)

## 2020-05-29 LAB — COMPREHENSIVE METABOLIC PANEL
ALT: 37 U/L (ref 0–44)
AST: 42 U/L — ABNORMAL HIGH (ref 15–41)
Albumin: 4.2 g/dL (ref 3.5–5.0)
Alkaline Phosphatase: 76 U/L (ref 38–126)
Anion gap: 11 (ref 5–15)
BUN: 12 mg/dL (ref 6–20)
CO2: 23 mmol/L (ref 22–32)
Calcium: 9 mg/dL (ref 8.9–10.3)
Chloride: 103 mmol/L (ref 98–111)
Creatinine, Ser: 0.95 mg/dL (ref 0.61–1.24)
GFR calc Af Amer: >60 mL/min (ref >60)
GFR calc non Af Amer: >60 mL/min (ref >60)
Glucose, Bld: 154 mg/dL — ABNORMAL HIGH (ref 70–99)
Potassium: 3.3 mmol/L — ABNORMAL LOW (ref 3.5–5.1)
Sodium: 137 mmol/L (ref 135–145)
Total Bilirubin: 0.5 mg/dL (ref 0.3–1.2)
Total Protein: 7.3 g/dL (ref 6.5–8.1)

## 2020-05-29 MED ORDER — FENTANYL CITRATE (PF) 100 MCG/2ML IJ SOLN
50.0000 ug | Freq: Once | INTRAMUSCULAR | Status: AC
  Administered 2020-05-29: 50 ug via INTRAVENOUS
  Filled 2020-05-29: qty 2

## 2020-05-29 MED ORDER — IOHEXOL 300 MG/ML  SOLN
100.0000 mL | Freq: Once | INTRAMUSCULAR | Status: AC | PRN
  Administered 2020-05-29: 100 mL via INTRAVENOUS

## 2020-05-29 MED ORDER — FENTANYL CITRATE (PF) 100 MCG/2ML IJ SOLN
50.0000 ug | Freq: Once | INTRAMUSCULAR | Status: AC
  Administered 2020-05-29: 50 ug via INTRAVENOUS

## 2020-05-29 MED ORDER — TETANUS-DIPHTH-ACELL PERTUSSIS 5-2.5-18.5 LF-MCG/0.5 IM SUSP
0.5000 mL | Freq: Once | INTRAMUSCULAR | Status: AC
  Administered 2020-05-29: 0.5 mL via INTRAMUSCULAR
  Filled 2020-05-29: qty 0.5
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
## 2020-05-29 NOTE — ED Notes (Signed)
Patient's mother, Travian Kerner, would like updates at [phone]
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
## 2020-05-30 ENCOUNTER — Inpatient Hospital Stay (HOSPITAL_COMMUNITY): Payer: No Typology Code available for payment source

## 2020-05-30 ENCOUNTER — Encounter (HOSPITAL_COMMUNITY): Payer: Self-pay

## 2020-05-30 ENCOUNTER — Inpatient Hospital Stay (HOSPITAL_COMMUNITY): Payer: No Typology Code available for payment source | Admitting: Anesthesiology

## 2020-05-30 ENCOUNTER — Encounter (HOSPITAL_COMMUNITY): Admission: EM | Disposition: A | Discharge status: Home / Self Care | Payer: Self-pay | Source: Home / Self Care

## 2020-05-30 DIAGNOSIS — S32461A Displaced associated transverse-posterior fracture of right acetabulum, initial encounter for closed fracture: Secondary | ICD-10-CM | POA: Diagnosis present

## 2020-05-30 DIAGNOSIS — D62 Acute posthemorrhagic anemia: Secondary | ICD-10-CM | POA: Diagnosis not present

## 2020-05-30 DIAGNOSIS — Z6836 Body mass index (BMI) 36.0-36.9, adult: Secondary | ICD-10-CM | POA: Diagnosis not present

## 2020-05-30 DIAGNOSIS — K5900 Constipation, unspecified: Secondary | ICD-10-CM | POA: Diagnosis not present

## 2020-05-30 DIAGNOSIS — S73004A Unspecified dislocation of right hip, initial encounter: Secondary | ICD-10-CM

## 2020-05-30 DIAGNOSIS — Y92410 Unspecified street and highway as the place of occurrence of the external cause: Secondary | ICD-10-CM | POA: Diagnosis not present

## 2020-05-30 DIAGNOSIS — S7401XA Injury of sciatic nerve at hip and thigh level, right leg, initial encounter: Secondary | ICD-10-CM

## 2020-05-30 DIAGNOSIS — Z7289 Other problems related to lifestyle: Secondary | ICD-10-CM | POA: Diagnosis not present

## 2020-05-30 DIAGNOSIS — M25561 Pain in right knee: Secondary | ICD-10-CM | POA: Diagnosis present

## 2020-05-30 DIAGNOSIS — G589 Mononeuropathy, unspecified: Secondary | ICD-10-CM | POA: Diagnosis present

## 2020-05-30 DIAGNOSIS — R000 Tachycardia, unspecified: Secondary | ICD-10-CM | POA: Diagnosis not present

## 2020-05-30 DIAGNOSIS — Z79899 Other long term (current) drug therapy: Secondary | ICD-10-CM | POA: Diagnosis not present

## 2020-05-30 DIAGNOSIS — S32038A Other fracture of third lumbar vertebra, initial encounter for closed fracture: Secondary | ICD-10-CM | POA: Diagnosis present

## 2020-05-30 DIAGNOSIS — F17210 Nicotine dependence, cigarettes, uncomplicated: Secondary | ICD-10-CM | POA: Diagnosis present

## 2020-05-30 DIAGNOSIS — E669 Obesity, unspecified: Secondary | ICD-10-CM | POA: Diagnosis present

## 2020-05-30 DIAGNOSIS — Z20822 Contact with and (suspected) exposure to covid-19: Secondary | ICD-10-CM | POA: Diagnosis present

## 2020-05-30 DIAGNOSIS — S32401A Unspecified fracture of right acetabulum, initial encounter for closed fracture: Secondary | ICD-10-CM

## 2020-05-30 DIAGNOSIS — S73014A Posterior dislocation of right hip, initial encounter: Secondary | ICD-10-CM | POA: Diagnosis present

## 2020-05-30 HISTORY — PX: OPEN REDUCTION INTERNAL FIXATION ACETABULUM FRACTURE POSTERIOR: SHX6833

## 2020-05-30 HISTORY — PX: ORIF ACETABULUM FRACTURE: SUR917

## 2020-05-30 LAB — COMPREHENSIVE METABOLIC PANEL
ALT: 44 U/L (ref 0–44)
AST: 59 U/L — ABNORMAL HIGH (ref 15–41)
Albumin: 4.3 g/dL (ref 3.5–5.0)
Alkaline Phosphatase: 60 U/L (ref 38–126)
Anion gap: 12 (ref 5–15)
BUN: 10 mg/dL (ref 6–20)
CO2: 22 mmol/L (ref 22–32)
Calcium: 9.1 mg/dL (ref 8.9–10.3)
Chloride: 101 mmol/L (ref 98–111)
Creatinine, Ser: 0.88 mg/dL (ref 0.61–1.24)
GFR calc Af Amer: >60 mL/min (ref >60)
GFR calc non Af Amer: >60 mL/min (ref >60)
Glucose, Bld: 152 mg/dL — ABNORMAL HIGH (ref 70–99)
Potassium: 4.3 mmol/L (ref 3.5–5.1)
Sodium: 135 mmol/L (ref 135–145)
Total Bilirubin: 0.6 mg/dL (ref 0.3–1.2)
Total Protein: 7.4 g/dL (ref 6.5–8.1)

## 2020-05-30 LAB — POCT I-STAT EG7
Acid-Base Excess: 2 mmol/L (ref 0.0–2.0)
Bicarbonate: 30.3 mmol/L — ABNORMAL HIGH (ref 20.0–28.0)
Calcium, Ion: 1.09 mmol/L — ABNORMAL LOW (ref 1.15–1.40)
HCT: 39 % (ref 39.0–52.0)
Hemoglobin: 13.3 g/dL (ref 13.0–17.0)
O2 Saturation: 25 %
Patient temperature: 36.6
Potassium: 5.9 mmol/L — ABNORMAL HIGH (ref 3.5–5.1)
Sodium: 136 mmol/L (ref 135–145)
TCO2: 32 mmol/L (ref 22–32)
pCO2, Ven: 63 mmHg — ABNORMAL HIGH (ref 44.0–60.0)
pH, Ven: 7.287 (ref 7.250–7.430)
pO2, Ven: 19 mmHg — CL (ref 32.0–45.0)

## 2020-05-30 LAB — CBC
HCT: 44.1 % (ref 39.0–52.0)
Hemoglobin: 14.1 g/dL (ref 13.0–17.0)
MCH: 29.3 pg (ref 26.0–34.0)
MCHC: 32 g/dL (ref 30.0–36.0)
MCV: 91.5 fL (ref 80.0–100.0)
Platelets: 259 10*3/uL (ref 150–400)
RBC: 4.82 MIL/uL (ref 4.22–5.81)
RDW: 12.7 % (ref 11.5–15.5)
WBC: 16.2 10*3/uL — ABNORMAL HIGH (ref 4.0–10.5)
nRBC: 0 % (ref 0.0–0.2)

## 2020-05-30 LAB — URINALYSIS, ROUTINE W REFLEX MICROSCOPIC
Bilirubin Urine: NEGATIVE
Glucose, UA: NEGATIVE mg/dL
Ketones, ur: NEGATIVE mg/dL
Leukocytes,Ua: NEGATIVE
Nitrite: NEGATIVE
Protein, ur: NEGATIVE mg/dL
Specific Gravity, Urine: >1.046 — ABNORMAL HIGH (ref 1.005–1.030)
pH: 5 (ref 5.0–8.0)

## 2020-05-30 LAB — POCT I-STAT, CHEM 8
BUN: 10 mg/dL (ref 6–20)
Calcium, Ion: 1.14 mmol/L — ABNORMAL LOW (ref 1.15–1.40)
Chloride: 99 mmol/L (ref 98–111)
Creatinine, Ser: 0.7 mg/dL (ref 0.61–1.24)
Glucose, Bld: 122 mg/dL — ABNORMAL HIGH (ref 70–99)
HCT: 39 % (ref 39.0–52.0)
Hemoglobin: 13.3 g/dL (ref 13.0–17.0)
Potassium: 3.9 mmol/L (ref 3.5–5.1)
Sodium: 135 mmol/L (ref 135–145)
TCO2: 29 mmol/L (ref 22–32)

## 2020-05-30 LAB — LACTIC ACID, PLASMA: Lactic Acid, Venous: 2.7 mmol/L (ref 0.5–1.9)

## 2020-05-30 LAB — SARS CORONAVIRUS 2 BY RT PCR (HOSPITAL ORDER, PERFORMED IN ~~LOC~~ HOSPITAL LAB): SARS Coronavirus 2: NEGATIVE

## 2020-05-30 LAB — ABO/RH: ABO/RH(D): O POS

## 2020-05-30 LAB — PREPARE RBC (CROSSMATCH)

## 2020-05-30 LAB — ETHANOL: Alcohol, Ethyl (B): <10 mg/dL (ref <10)

## 2020-05-30 SURGERY — OPEN REDUCTION INTERNAL FIXATION ACETABULUM FRACTURE POSTERIOR
Anesthesia: General | Site: Pelvis | Laterality: Right

## 2020-05-30 MED ORDER — DEXAMETHASONE SODIUM PHOSPHATE 10 MG/ML IJ SOLN
INTRAMUSCULAR | Status: AC
  Filled 2020-05-30: qty 1

## 2020-05-30 MED ORDER — DOCUSATE SODIUM 100 MG PO CAPS
100.0000 mg | ORAL_CAPSULE | Freq: Two times a day (BID) | ORAL | Status: DC
  Administered 2020-05-30 (×2): 100 mg via ORAL
  Filled 2020-05-30 (×2): qty 1

## 2020-05-30 MED ORDER — POVIDONE-IODINE 10 % EX SWAB: 2.0000 "application " | Freq: Once | CUTANEOUS | Status: DC

## 2020-05-30 MED ORDER — SODIUM CHLORIDE 0.9% IV SOLUTION: Freq: Once | INTRAVENOUS | Status: DC

## 2020-05-30 MED ORDER — POTASSIUM CHLORIDE IN NACL 20-0.9 MEQ/L-% IV SOLN
INTRAVENOUS | Status: DC
  Filled 2020-05-30 (×2): qty 1000

## 2020-05-30 MED ORDER — HYDROMORPHONE HCL 1 MG/ML IJ SOLN
INTRAMUSCULAR | Status: AC
  Filled 2020-05-30: qty 1

## 2020-05-30 MED ORDER — MIDAZOLAM HCL 5 MG/5ML IJ SOLN
INTRAMUSCULAR | Status: DC | PRN
  Administered 2020-05-30: 2 mg via INTRAVENOUS

## 2020-05-30 MED ORDER — TRANEXAMIC ACID-NACL 1000-0.7 MG/100ML-% IV SOLN
INTRAVENOUS | Status: AC
  Filled 2020-05-30: qty 100

## 2020-05-30 MED ORDER — ROCURONIUM BROMIDE 10 MG/ML (PF) SYRINGE
PREFILLED_SYRINGE | INTRAVENOUS | Status: DC | PRN
  Administered 2020-05-30: 20 mg via INTRAVENOUS
  Administered 2020-05-30: 100 mg via INTRAVENOUS
  Administered 2020-05-30: 40 mg via INTRAVENOUS

## 2020-05-30 MED ORDER — CHLORHEXIDINE GLUCONATE CLOTH 2 % EX PADS: 6.0000 | MEDICATED_PAD | Freq: Every day | CUTANEOUS | Status: DC

## 2020-05-30 MED ORDER — OXYCODONE HCL 5 MG PO TABS
5.0000 mg | ORAL_TABLET | Freq: Once | ORAL | Status: AC | PRN
  Administered 2020-05-30: 5 mg via ORAL

## 2020-05-30 MED ORDER — METHOCARBAMOL 1000 MG/10ML IJ SOLN
1000.0000 mg | Freq: Three times a day (TID) | INTRAVENOUS | Status: DC
  Administered 2020-05-30: 1000 mg via INTRAVENOUS
  Filled 2020-05-30 (×6): qty 10

## 2020-05-30 MED ORDER — ACETAMINOPHEN 500 MG PO TABS
1000.0000 mg | ORAL_TABLET | Freq: Four times a day (QID) | ORAL | Status: DC
  Administered 2020-05-30 – 2020-06-03 (×14): 1000 mg via ORAL
  Filled 2020-05-30 (×14): qty 2

## 2020-05-30 MED ORDER — CHLORHEXIDINE GLUCONATE 4 % EX LIQD: 60.0000 mL | Freq: Once | CUTANEOUS | Status: DC

## 2020-05-30 MED ORDER — OXYCODONE HCL 5 MG/5ML PO SOLN: 5.0000 mg | Freq: Once | ORAL | Status: AC | PRN

## 2020-05-30 MED ORDER — FENTANYL CITRATE (PF) 100 MCG/2ML IJ SOLN
INTRAMUSCULAR | Status: DC | PRN
  Administered 2020-05-30 (×4): 50 ug via INTRAVENOUS
  Administered 2020-05-30: 100 ug via INTRAVENOUS
  Administered 2020-05-30 (×4): 50 ug via INTRAVENOUS

## 2020-05-30 MED ORDER — OXYCODONE HCL 5 MG PO TABS
ORAL_TABLET | ORAL | Status: AC
  Filled 2020-05-30: qty 1

## 2020-05-30 MED ORDER — METHOCARBAMOL 1000 MG/10ML IJ SOLN
500.0000 mg | Freq: Four times a day (QID) | INTRAVENOUS | Status: DC | PRN
  Filled 2020-05-30: qty 5

## 2020-05-30 MED ORDER — ESMOLOL HCL 100 MG/10ML IV SOLN
INTRAVENOUS | Status: DC | PRN
Start: 2020-05-30 — End: 2020-05-30
  Administered 2020-05-30: 30 mg via INTRAVENOUS

## 2020-05-30 MED ORDER — POLYETHYLENE GLYCOL 3350 17 G PO PACK: 17.0000 g | PACK | Freq: Every day | ORAL | Status: DC | PRN

## 2020-05-30 MED ORDER — MORPHINE SULFATE (PF) 2 MG/ML IV SOLN
1.0000 mg | INTRAVENOUS | Status: DC | PRN
  Administered 2020-05-30 (×2): 2 mg via INTRAVENOUS
  Filled 2020-05-30 (×2): qty 1

## 2020-05-30 MED ORDER — TRANEXAMIC ACID-NACL 1000-0.7 MG/100ML-% IV SOLN
INTRAVENOUS | Status: DC | PRN
  Administered 2020-05-30: 1000 mg via INTRAVENOUS

## 2020-05-30 MED ORDER — HYDROMORPHONE HCL 1 MG/ML IJ SOLN
0.2500 mg | INTRAMUSCULAR | Status: DC | PRN
  Administered 2020-05-30 (×4): 0.5 mg via INTRAVENOUS

## 2020-05-30 MED ORDER — DEXAMETHASONE SODIUM PHOSPHATE 10 MG/ML IJ SOLN
INTRAMUSCULAR | Status: DC | PRN
  Administered 2020-05-30: 10 mg via INTRAVENOUS

## 2020-05-30 MED ORDER — CHLORHEXIDINE GLUCONATE 0.12 % MT SOLN
15.0000 mL | OROMUCOSAL | Status: AC
  Administered 2020-05-30: 15 mL via OROMUCOSAL
  Filled 2020-05-30: qty 15

## 2020-05-30 MED ORDER — ENOXAPARIN SODIUM 40 MG/0.4ML ~~LOC~~ SOLN
40.0000 mg | SUBCUTANEOUS | Status: DC
  Administered 2020-05-31 – 2020-06-03 (×4): 40 mg via SUBCUTANEOUS
  Filled 2020-05-30 (×4): qty 0.4

## 2020-05-30 MED ORDER — METHOCARBAMOL 500 MG PO TABS
500.0000 mg | ORAL_TABLET | Freq: Four times a day (QID) | ORAL | Status: DC | PRN
  Administered 2020-05-30 – 2020-06-01 (×4): 500 mg via ORAL
  Filled 2020-05-30 (×3): qty 1

## 2020-05-30 MED ORDER — SUCCINYLCHOLINE CHLORIDE 200 MG/10ML IV SOSY
PREFILLED_SYRINGE | INTRAVENOUS | Status: AC
  Filled 2020-05-30: qty 10

## 2020-05-30 MED ORDER — CEFAZOLIN SODIUM-DEXTROSE 2-4 GM/100ML-% IV SOLN
2.0000 g | INTRAVENOUS | Status: AC
  Administered 2020-05-30: 2 g via INTRAVENOUS
  Filled 2020-05-30: qty 100

## 2020-05-30 MED ORDER — 0.9 % SODIUM CHLORIDE (POUR BTL) OPTIME
TOPICAL | Status: DC | PRN
  Administered 2020-05-30: 1000 mL

## 2020-05-30 MED ORDER — FENTANYL CITRATE (PF) 100 MCG/2ML IJ SOLN
50.0000 ug | INTRAMUSCULAR | Status: DC | PRN
  Administered 2020-05-30: 50 ug via INTRAVENOUS
  Filled 2020-05-30 (×2): qty 2

## 2020-05-30 MED ORDER — LIDOCAINE 2% (20 MG/ML) 5 ML SYRINGE
INTRAMUSCULAR | Status: AC
  Filled 2020-05-30: qty 5

## 2020-05-30 MED ORDER — PANTOPRAZOLE SODIUM 40 MG PO TBEC
40.0000 mg | DELAYED_RELEASE_TABLET | Freq: Every day | ORAL | Status: DC
  Administered 2020-05-30 – 2020-06-03 (×5): 40 mg via ORAL
  Filled 2020-05-30 (×5): qty 1

## 2020-05-30 MED ORDER — VANCOMYCIN HCL 1000 MG IV SOLR
INTRAVENOUS | Status: AC
  Filled 2020-05-30: qty 1000

## 2020-05-30 MED ORDER — DOCUSATE SODIUM 100 MG PO CAPS
100.0000 mg | ORAL_CAPSULE | Freq: Two times a day (BID) | ORAL | Status: DC
  Administered 2020-05-30 – 2020-06-01 (×4): 100 mg via ORAL
  Filled 2020-05-30 (×4): qty 1

## 2020-05-30 MED ORDER — TOBRAMYCIN SULFATE POWD
Status: DC | PRN
  Administered 2020-05-30: 1200 mg via TOPICAL

## 2020-05-30 MED ORDER — ONDANSETRON HCL 4 MG/2ML IJ SOLN
4.0000 mg | Freq: Four times a day (QID) | INTRAMUSCULAR | Status: DC | PRN
  Administered 2020-05-30: 4 mg via INTRAVENOUS

## 2020-05-30 MED ORDER — MIDAZOLAM HCL 2 MG/2ML IJ SOLN
INTRAMUSCULAR | Status: AC
  Filled 2020-05-30: qty 2

## 2020-05-30 MED ORDER — ONDANSETRON HCL 4 MG/2ML IJ SOLN
INTRAMUSCULAR | Status: AC
  Filled 2020-05-30: qty 2

## 2020-05-30 MED ORDER — DIPHENHYDRAMINE HCL 25 MG PO CAPS: 25.0000 mg | ORAL_CAPSULE | Freq: Four times a day (QID) | ORAL | Status: DC | PRN

## 2020-05-30 MED ORDER — PHENYLEPHRINE 40 MCG/ML (10ML) SYRINGE FOR IV PUSH (FOR BLOOD PRESSURE SUPPORT)
PREFILLED_SYRINGE | INTRAVENOUS | Status: AC
  Filled 2020-05-30: qty 10

## 2020-05-30 MED ORDER — LACTATED RINGERS IV SOLN: INTRAVENOUS | Status: DC

## 2020-05-30 MED ORDER — TOBRAMYCIN SULFATE 1.2 G IJ SOLR
INTRAMUSCULAR | Status: AC
  Filled 2020-05-30: qty 1.2

## 2020-05-30 MED ORDER — GABAPENTIN 100 MG PO CAPS
100.0000 mg | ORAL_CAPSULE | Freq: Three times a day (TID) | ORAL | Status: DC
  Administered 2020-05-30 – 2020-05-31 (×2): 100 mg via ORAL
  Filled 2020-05-30 (×2): qty 1

## 2020-05-30 MED ORDER — METOPROLOL TARTRATE 5 MG/5ML IV SOLN
5.0000 mg | Freq: Four times a day (QID) | INTRAVENOUS | Status: DC | PRN
  Filled 2020-05-30: qty 5

## 2020-05-30 MED ORDER — SODIUM CHLORIDE 0.9 % IV SOLN: INTRAVENOUS | Status: DC | PRN

## 2020-05-30 MED ORDER — PROPOFOL 10 MG/ML IV BOLUS
INTRAVENOUS | Status: DC | PRN
  Administered 2020-05-30: 200 mg via INTRAVENOUS

## 2020-05-30 MED ORDER — MEPERIDINE HCL 25 MG/ML IJ SOLN: 6.2500 mg | INTRAMUSCULAR | Status: DC | PRN

## 2020-05-30 MED ORDER — OXYCODONE HCL 5 MG PO TABS: 5.0000 mg | ORAL_TABLET | ORAL | Status: DC | PRN

## 2020-05-30 MED ORDER — ROCURONIUM BROMIDE 10 MG/ML (PF) SYRINGE
PREFILLED_SYRINGE | INTRAVENOUS | Status: AC
  Filled 2020-05-30: qty 10

## 2020-05-30 MED ORDER — FENTANYL CITRATE (PF) 250 MCG/5ML IJ SOLN
INTRAMUSCULAR | Status: AC
  Filled 2020-05-30: qty 5

## 2020-05-30 MED ORDER — AMISULPRIDE (ANTIEMETIC) 5 MG/2ML IV SOLN: 10.0000 mg | Freq: Once | INTRAVENOUS | Status: DC | PRN

## 2020-05-30 MED ORDER — PHENYLEPHRINE 40 MCG/ML (10ML) SYRINGE FOR IV PUSH (FOR BLOOD PRESSURE SUPPORT)
PREFILLED_SYRINGE | INTRAVENOUS | Status: DC | PRN
  Administered 2020-05-30: 80 ug via INTRAVENOUS

## 2020-05-30 MED ORDER — ALBUMIN HUMAN 5 % IV SOLN: INTRAVENOUS | Status: DC | PRN

## 2020-05-30 MED ORDER — SODIUM CHLORIDE 0.9 % IR SOLN
Status: DC | PRN
  Administered 2020-05-30: 3000 mL

## 2020-05-30 MED ORDER — METOCLOPRAMIDE HCL 5 MG/ML IJ SOLN: 5.0000 mg | Freq: Three times a day (TID) | INTRAMUSCULAR | Status: DC | PRN

## 2020-05-30 MED ORDER — LIDOCAINE 2% (20 MG/ML) 5 ML SYRINGE
INTRAMUSCULAR | Status: DC | PRN
  Administered 2020-05-30: 100 mg via INTRAVENOUS

## 2020-05-30 MED ORDER — ONDANSETRON HCL 4 MG PO TABS: 4.0000 mg | ORAL_TABLET | Freq: Four times a day (QID) | ORAL | Status: DC | PRN

## 2020-05-30 MED ORDER — OXYCODONE HCL 5 MG PO TABS
10.0000 mg | ORAL_TABLET | ORAL | Status: DC | PRN
  Administered 2020-05-30 – 2020-06-03 (×15): 15 mg via ORAL
  Filled 2020-05-30 (×15): qty 3

## 2020-05-30 MED ORDER — DIPHENHYDRAMINE HCL 50 MG/ML IJ SOLN: 12.5000 mg | Freq: Four times a day (QID) | INTRAMUSCULAR | Status: DC | PRN

## 2020-05-30 MED ORDER — OXYCODONE HCL 5 MG PO TABS
10.0000 mg | ORAL_TABLET | ORAL | Status: DC | PRN
  Administered 2020-05-30: 10 mg via ORAL
  Filled 2020-05-30: qty 2

## 2020-05-30 MED ORDER — ONDANSETRON 4 MG PO TBDP: 4.0000 mg | ORAL_TABLET | Freq: Four times a day (QID) | ORAL | Status: DC | PRN

## 2020-05-30 MED ORDER — ENOXAPARIN SODIUM 30 MG/0.3ML ~~LOC~~ SOLN
30.0000 mg | Freq: Two times a day (BID) | SUBCUTANEOUS | Status: DC
  Administered 2020-05-30: 30 mg via SUBCUTANEOUS
  Filled 2020-05-30: qty 0.3

## 2020-05-30 MED ORDER — PANTOPRAZOLE SODIUM 40 MG IV SOLR: 40.0000 mg | Freq: Every day | INTRAVENOUS | Status: DC

## 2020-05-30 MED ORDER — PROMETHAZINE HCL 25 MG/ML IJ SOLN: 6.2500 mg | INTRAMUSCULAR | Status: DC | PRN

## 2020-05-30 MED ORDER — PROPOFOL 10 MG/ML IV BOLUS
INTRAVENOUS | Status: AC
  Filled 2020-05-30: qty 20

## 2020-05-30 MED ORDER — DEXMEDETOMIDINE HCL 200 MCG/2ML IV SOLN
INTRAVENOUS | Status: DC | PRN
  Administered 2020-05-30 (×2): 12 ug via INTRAVENOUS

## 2020-05-30 MED ORDER — KCL IN DEXTROSE-NACL 20-5-0.45 MEQ/L-%-% IV SOLN
INTRAVENOUS | Status: DC
  Filled 2020-05-30 (×2): qty 1000

## 2020-05-30 MED ORDER — HYDROMORPHONE HCL 1 MG/ML IJ SOLN: 0.5000 mg | INTRAMUSCULAR | Status: DC | PRN

## 2020-05-30 MED ORDER — VANCOMYCIN HCL 1 G IV SOLR
INTRAVENOUS | Status: DC | PRN
  Administered 2020-05-30: 1000 mg via TOPICAL

## 2020-05-30 MED ORDER — ACETAMINOPHEN 500 MG PO TABS
1000.0000 mg | ORAL_TABLET | Freq: Three times a day (TID) | ORAL | Status: DC
  Administered 2020-05-30: 1000 mg via ORAL
  Filled 2020-05-30: qty 2

## 2020-05-30 MED ORDER — METOCLOPRAMIDE HCL 5 MG PO TABS: 5.0000 mg | ORAL_TABLET | Freq: Three times a day (TID) | ORAL | Status: DC | PRN

## 2020-05-30 MED ORDER — METHOCARBAMOL 500 MG PO TABS
ORAL_TABLET | ORAL | Status: AC
  Filled 2020-05-30: qty 1

## 2020-05-30 MED ORDER — HYDRALAZINE HCL 20 MG/ML IJ SOLN: 10.0000 mg | INTRAMUSCULAR | Status: DC | PRN

## 2020-05-30 MED ORDER — PHENYLEPHRINE HCL-NACL 10-0.9 MG/250ML-% IV SOLN
INTRAVENOUS | Status: DC | PRN
  Administered 2020-05-30: 25 ug/min via INTRAVENOUS

## 2020-05-30 MED ORDER — ONDANSETRON HCL 4 MG/2ML IJ SOLN: 4.0000 mg | Freq: Four times a day (QID) | INTRAMUSCULAR | Status: DC | PRN

## 2020-05-30 MED ORDER — CEFAZOLIN SODIUM-DEXTROSE 2-4 GM/100ML-% IV SOLN
2.0000 g | Freq: Three times a day (TID) | INTRAVENOUS | Status: AC
  Administered 2020-05-30 – 2020-05-31 (×3): 2 g via INTRAVENOUS
  Filled 2020-05-30 (×3): qty 100

## 2020-05-30 MED ORDER — OXYCODONE HCL 5 MG PO TABS
5.0000 mg | ORAL_TABLET | ORAL | Status: DC | PRN
  Administered 2020-05-30: 5 mg via ORAL
  Filled 2020-05-30: qty 1

## 2020-05-30 SURGICAL SUPPLY — 91 items
BIT DRILL 2.5X300 (BIT) ×1 IMPLANT
BIT DRILL CANN 4.5MM (BIT) ×1 IMPLANT
BIT DRILL QC 3.5X195 (BIT) ×3 IMPLANT
BLADE 11 SAFETY STRL DISP (BLADE) ×3 IMPLANT
BLADE CLIPPER SURG (BLADE) IMPLANT
BLADE SURG 11 STRL SS (BLADE) IMPLANT
BONE CANC CHIPS 20CC PCAN1/4 (Bone Implant) ×3 IMPLANT
BRUSH SCRUB EZ PLAIN DRY (MISCELLANEOUS) ×6 IMPLANT
CHIPS CANC BONE 20CC PCAN1/4 (Bone Implant) ×1 IMPLANT
CHLORAPREP W/TINT 26 (MISCELLANEOUS) ×3 IMPLANT
COVER SURGICAL LIGHT HANDLE (MISCELLANEOUS) ×3 IMPLANT
COVER WAND RF STERILE (DRAPES) IMPLANT
DEFOGGER ANTIFOG KIT (MISCELLANEOUS) ×3 IMPLANT
DERMABOND ADVANCED (GAUZE/BANDAGES/DRESSINGS) ×2
DERMABOND ADVANCED .7 DNX12 (GAUZE/BANDAGES/DRESSINGS) ×1 IMPLANT
DRAPE C-ARM 42X72 X-RAY (DRAPES) ×3 IMPLANT
DRAPE C-ARMOR (DRAPES) ×3 IMPLANT
DRAPE INCISE IOBAN 85X60 (DRAPES) IMPLANT
DRAPE ORTHO SPLIT 77X108 STRL (DRAPES) ×4
DRAPE SURG 17X23 STRL (DRAPES) ×18 IMPLANT
DRAPE SURG ORHT 6 SPLT 77X108 (DRAPES) ×2 IMPLANT
DRAPE U-SHAPE 47X51 STRL (DRAPES) ×3 IMPLANT
DRILL BIT 2.5X300 (BIT) ×2
DRILL BIT CANN 4.5MM (BIT) ×2
DRSG MEPILEX BORDER 4X12 (GAUZE/BANDAGES/DRESSINGS) ×3 IMPLANT
DRSG MEPILEX BORDER 4X4 (GAUZE/BANDAGES/DRESSINGS) ×3 IMPLANT
DRSG MEPILEX BORDER 4X8 (GAUZE/BANDAGES/DRESSINGS) IMPLANT
ELECT BLADE 4.0 EZ CLEAN MEGAD (MISCELLANEOUS) ×3
ELECT BLADE 6.5 EXT (BLADE) IMPLANT
ELECT REM PT RETURN 9FT ADLT (ELECTROSURGICAL) ×3
ELECTRODE BLDE 4.0 EZ CLN MEGD (MISCELLANEOUS) ×1 IMPLANT
ELECTRODE REM PT RTRN 9FT ADLT (ELECTROSURGICAL) ×1 IMPLANT
GLOVE BIO SURGEON STRL SZ 6.5 (GLOVE) ×6 IMPLANT
GLOVE BIO SURGEON STRL SZ7.5 (GLOVE) ×12 IMPLANT
GLOVE BIO SURGEONS STRL SZ 6.5 (GLOVE) ×3
GLOVE BIOGEL PI IND STRL 6.5 (GLOVE) ×1 IMPLANT
GLOVE BIOGEL PI IND STRL 7.5 (GLOVE) ×1 IMPLANT
GLOVE BIOGEL PI INDICATOR 6.5 (GLOVE) ×2
GLOVE BIOGEL PI INDICATOR 7.5 (GLOVE) ×2
GOWN STRL REUS W/ TWL LRG LVL3 (GOWN DISPOSABLE) ×4 IMPLANT
GOWN STRL REUS W/TWL LRG LVL3 (GOWN DISPOSABLE) ×8
GUIDEWIRE 2.0MM (WIRE) ×3 IMPLANT
GUIDEWIRE THREADED 2.8MM (WIRE) ×3 IMPLANT
HANDPIECE INTERPULSE COAX TIP (DISPOSABLE) ×2
KIT BASIN OR (CUSTOM PROCEDURE TRAY) ×3 IMPLANT
KIT TURNOVER KIT B (KITS) ×3 IMPLANT
MANIFOLD NEPTUNE II (INSTRUMENTS) ×3 IMPLANT
NS IRRIG 1000ML POUR BTL (IV SOLUTION) ×3 IMPLANT
PACK TOTAL JOINT (CUSTOM PROCEDURE TRAY) ×3 IMPLANT
PACK UNIVERSAL I (CUSTOM PROCEDURE TRAY) ×3 IMPLANT
PAD ARMBOARD 7.5X6 YLW CONV (MISCELLANEOUS) ×12 IMPLANT
PLATE BONE 91MM 7HOLE PELVIC (Plate) ×3 IMPLANT
PLATE BONE LOCK 65MM 5 HOLE (Plate) ×3 IMPLANT
RETRIEVER SUT HEWSON (MISCELLANEOUS) ×3 IMPLANT
SCREW CANN 6.5X125X32 (Screw) ×3 IMPLANT
SCREW CORTEX 3.5 30MM (Screw) ×4 IMPLANT
SCREW CORTEX 3.5 32MM (Screw) ×2 IMPLANT
SCREW CORTEX 3.5 34MM (Screw) ×2 IMPLANT
SCREW CORTEX 3.5 38MM (Screw) ×2 IMPLANT
SCREW CORTEX 3.5X40MM (Screw) ×3 IMPLANT
SCREW CORTEX 3.5X45MM (Screw) ×3 IMPLANT
SCREW LOCK CORT ST 3.5X30 (Screw) ×2 IMPLANT
SCREW LOCK CORT ST 3.5X32 (Screw) ×1 IMPLANT
SCREW LOCK CORT ST 3.5X34 (Screw) ×1 IMPLANT
SCREW LOCK CORT ST 3.5X38 (Screw) ×1 IMPLANT
SCREW PELVIC CORT ST 3.5X75 (Screw) ×1 IMPLANT
SCREW SELF TAP 3.5 75M (Screw) ×2 IMPLANT
SCREW SHANZ 5.0X170 (Screw) ×3 IMPLANT
SET HNDPC FAN SPRY TIP SCT (DISPOSABLE) ×1 IMPLANT
SPONGE LAP 18X18 RF (DISPOSABLE) IMPLANT
STAPLER VISISTAT 35W (STAPLE) ×3 IMPLANT
SUCTION FRAZIER HANDLE 10FR (MISCELLANEOUS) ×2
SUCTION TUBE FRAZIER 10FR DISP (MISCELLANEOUS) ×1 IMPLANT
SUT ETHILON 2 0 PSLX (SUTURE) IMPLANT
SUT FIBERWIRE #2 38 T-5 BLUE (SUTURE) ×6
SUT MNCRL AB 3-0 PS2 18 (SUTURE) ×3 IMPLANT
SUT MON AB 2-0 CT1 36 (SUTURE) IMPLANT
SUT VIC AB 0 CT1 27 (SUTURE) ×4
SUT VIC AB 0 CT1 27XBRD ANBCTR (SUTURE) ×2 IMPLANT
SUT VIC AB 1 CT1 18XCR BRD 8 (SUTURE) IMPLANT
SUT VIC AB 1 CT1 27 (SUTURE)
SUT VIC AB 1 CT1 27XBRD ANBCTR (SUTURE) IMPLANT
SUT VIC AB 1 CT1 8-18 (SUTURE)
SUT VIC AB 2-0 CT1 27 (SUTURE) ×4
SUT VIC AB 2-0 CT1 TAPERPNT 27 (SUTURE) ×2 IMPLANT
SUTURE FIBERWR #2 38 T-5 BLUE (SUTURE) ×2 IMPLANT
TOWEL GREEN STERILE (TOWEL DISPOSABLE) ×6 IMPLANT
TOWEL GREEN STERILE FF (TOWEL DISPOSABLE) ×6 IMPLANT
TRAY FOLEY MTR SLVR 16FR STAT (SET/KITS/TRAYS/PACK) ×3 IMPLANT
UNDERPAD 30X36 HEAVY ABSORB (UNDERPADS AND DIAPERS) ×3 IMPLANT
WATER STERILE IRR 1000ML POUR (IV SOLUTION) IMPLANT
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
## 2020-05-30 NOTE — Progress Notes (Signed)
Orthopedic Tech Progress Note Patient Details:  Jason Walter 25-Oct-1986 [id number]  Musculoskeletal Traction Type of Traction: Bucks Skin Traction Traction Location: RLE Traction Weight: 15 lbs   Post Interventions Patient Tolerated: Well Instructions Provided: Care of device   Donald Pore 05/30/2020, 9:45 AM

## 2020-05-30 NOTE — ED Notes (Signed)
Report given to Joseph RN

## 2020-05-30 NOTE — Anesthesia Preprocedure Evaluation (Signed)
Anesthesia Evaluation  Patient identified by MRN, date of birth, ID band Patient awake    Reviewed: Allergy & Precautions, NPO status , Patient's Chart, lab work & pertinent test results  Airway Mallampati: II  TM Distance: >3 FB Neck ROM: Full    Dental no notable dental hx.    Pulmonary neg pulmonary ROS, Current Smoker and Patient abstained from smoking.,    Pulmonary exam normal breath sounds clear to auscultation       Cardiovascular negative cardio ROS Normal cardiovascular exam Rhythm:Regular Rate:Normal     Neuro/Psych negative neurological ROS  negative psych ROS   GI/Hepatic negative GI ROS, Neg liver ROS,   Endo/Other  negative endocrine ROS  Renal/GU negative Renal ROS  negative genitourinary   Musculoskeletal negative musculoskeletal ROS (+)   Abdominal (+) + obese,   Peds negative pediatric ROS (+)  Hematology negative hematology ROS (+)   Anesthesia Other Findings   Reproductive/Obstetrics negative OB ROS                             Anesthesia Physical Anesthesia Plan  ASA: II  Anesthesia Plan: General   Post-op Pain Management:    Induction: Intravenous  PONV Risk Score and Plan: 1 and Ondansetron and Treatment may vary due to age or medical condition  Airway Management Planned: Oral ETT  Additional Equipment:   Intra-op Plan:   Post-operative Plan: Extubation in OR  Informed Consent: I have reviewed the patients History and Physical, chart, labs and discussed the procedure including the risks, benefits and alternatives for the proposed anesthesia with the patient or authorized representative who has indicated his/her understanding and acceptance.     Dental advisory given  Plan Discussed with: CRNA  Anesthesia Plan Comments:         Anesthesia Quick Evaluation
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
## 2020-05-30 NOTE — Discharge Instructions (Signed)
[facility] Discharge Instructions   General Discharge Instructions  WEIGHT BEARING STATUS: Touchdown weightbearing right lower extremity  RANGE OF MOTION/ACTIVITY: Okay for gentle hip motion   Wound Care: Incisions can be left open to air if there is no drainage. If incision continues to have drainage, follow wound care instructions below. Okay to shower if no drainage from incisions.  DVT/PE prophylaxis: Lovenox x 30 days  Diet: as you were eating previously.  Can use over the counter stool softeners and bowel preparations, such as Miralax, to help with bowel movements.  Narcotics can be constipating.  Be sure to drink plenty of fluids  PAIN MEDICATION USE AND EXPECTATIONS  You have likely been given narcotic medications to help control your pain.  After a traumatic event that results in an fracture (broken bone) with or without surgery, it is ok to use narcotic pain medications to help control one's pain.  We understand that everyone responds to pain differently and each individual patient will be evaluated on a regular basis for the continued need for narcotic medications. Ideally, narcotic medication use should last no more than 6-8 weeks (coinciding with fracture healing).   As a patient it is your responsibility as well to monitor narcotic medication use and report the amount and frequency you use these medications when you come to your office visit.   We would also advise that if you are using narcotic medications, you should take a dose prior to therapy to maximize you participation.  IF YOU ARE ON NARCOTIC MEDICATIONS IT IS NOT PERMISSIBLE TO OPERATE A MOTOR VEHICLE (MOTORCYCLE/CAR/TRUCK/MOPED) OR HEAVY MACHINERY DO NOT MIX NARCOTICS WITH OTHER CNS (CENTRAL NERVOUS SYSTEM) DEPRESSANTS SUCH AS ALCOHOL   STOP SMOKING OR USING NICOTINE PRODUCTS!!!!  As discussed nicotine severely impairs your body's ability to heal surgical and traumatic wounds but also impairs bone  healing.  Wounds and bone heal by forming microscopic blood vessels (angiogenesis) and nicotine is a vasoconstrictor (essentially, shrinks blood vessels).  Therefore, if vasoconstriction occurs to these microscopic blood vessels they essentially disappear and are unable to deliver necessary nutrients to the healing tissue.  This is one modifiable factor that you can do to dramatically increase your chances of healing your injury.    (This means no smoking, no nicotine gum, patches, etc)  DO NOT USE NONSTEROIDAL ANTI-INFLAMMATORY DRUGS (NSAID'S)  Using products such as Advil (ibuprofen), Aleve (naproxen), Motrin (ibuprofen) for additional pain control during fracture healing can delay and/or prevent the healing response.  If you would like to take over the counter (OTC) medication, Tylenol (acetaminophen) is ok.  However, some narcotic medications that are given for pain control contain acetaminophen as well. Therefore, you should not exceed more than 4000 mg of tylenol in a day if you do not have liver disease.  Also note that there are may OTC medicines, such as cold medicines and allergy medicines that my contain tylenol as well.  If you have any questions about medications and/or interactions please ask your doctor/PA or your pharmacist.      ICE AND ELEVATE INJURED/OPERATIVE EXTREMITY  Using ice and elevating the injured extremity above your heart can help with swelling and pain control.  Icing in a pulsatile fashion, such as 20 minutes on and 20 minutes off, can be followed.    Do not place ice directly on skin. Make sure there is a barrier between to skin and the ice pack.    Using frozen items such as frozen peas works  well as the conform nicely to the are that needs to be iced.  USE AN ACE WRAP OR TED HOSE FOR SWELLING CONTROL  In addition to icing and elevation, Ace wraps or TED hose are used to help limit and resolve swelling.  It is recommended to use Ace wraps or TED hose until you are  informed to stop.    When using Ace Wraps start the wrapping distally (farthest away from the body) and wrap proximally (closer to the body)   Example: If you had surgery on your leg or thing and you do not have a splint on, start the ace wrap at the toes and work your way up to the thigh        If you had surgery on your upper extremity and do not have a splint on, start the ace wrap at your fingers and work your way up to the upper arm  CALL THE OFFICE WITH ANY QUESTIONS OR CONCERNS: [phone]   VISIT OUR WEBSITE FOR ADDITIONAL INFORMATION: [email or web address]    Discharge Wound Care Instructions  Do NOT apply any ointments, solutions or lotions to pin sites or surgical wounds.  These prevent needed drainage and even though solutions like hydrogen peroxide kill bacteria, they also damage cells lining the pin sites that help fight infection.  Applying lotions or ointments can keep the wounds moist and can cause them to breakdown and open up as well. This can increase the risk for infection. When in doubt call the office.  Surgical incisions should be dressed daily.  If any drainage is noted, use one layer of adaptic, then gauze, Kerlix, and an ace wrap.  Once the incision is completely dry and without drainage, it may be left open to air out.  Showering may begin 36-48 hours later.  Cleaning gently with soap and water.  Traumatic wounds should be dressed daily as well.    One layer of adaptic, gauze, Kerlix, then ace wrap.  The adaptic can be discontinued once the draining has ceased    If you have a wet to dry dressing: wet the gauze with saline the squeeze as much saline out so the gauze is moist (not soaking wet), place moistened gauze over wound, then place a dry gauze over the moist one, followed by Kerlix wrap, then ace wrap.

## 2020-05-30 NOTE — Progress Notes (Signed)
Pt discussed with EDP.  Will need, ORIF of right acetabulum fx.  Will hope for fixation later today with Dr. Jena Gauss.  Will discuss further with him this am.  NPO for now.  Consult to follow

## 2020-05-30 NOTE — ED Notes (Signed)
Attempted report 

## 2020-05-30 NOTE — Progress Notes (Addendum)
Orthopedic Tech Progress Note Patient Details:  Shaunn Tackitt 07/28/1986 [id number]  Patient ID: Joellyn Haff, male   DOB: [birth year], 34 y.o.   MRN: [id number]   Donald Pore 05/30/2020, 9:09 AM

## 2020-05-30 NOTE — Transfer of Care (Signed)
Immediate Anesthesia Transfer of Care Note  Patient: Jason Walter  Procedure(s) Performed: OPEN REDUCTION INTERNAL FIXATION ACETABULUM FRACTURE POSTERIOR (Right Pelvis)  Patient Location: PACU  Anesthesia Type:General  Level of Consciousness: awake, alert  and oriented  Airway & Oxygen Therapy: Patient Spontanous Breathing  Post-op Assessment: Report given to RN and Post -op Vital signs reviewed and stable  Post vital signs: Reviewed and stable  Last Vitals:  Vitals Value Taken Time  BP 126/52 05/30/20 1942  Temp    Pulse 105 05/30/20 1944  Resp 14 05/30/20 1944  SpO2 98 % 05/30/20 1944  Vitals shown include unvalidated device data.  Last Pain:  Vitals:   05/30/20 1009  TempSrc:   PainSc: 10-Worst pain ever         Complications: No complications documented.
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
## 2020-05-30 NOTE — Anesthesia Postprocedure Evaluation (Signed)
Anesthesia Post Note  Patient: Macarthur Menges  Procedure(s) Performed: OPEN REDUCTION INTERNAL FIXATION ACETABULUM FRACTURE POSTERIOR (Right Pelvis)     Patient location during evaluation: PACU Anesthesia Type: General Level of consciousness: awake and alert Pain management: pain level controlled Vital Signs Assessment: post-procedure vital signs reviewed and stable Respiratory status: spontaneous breathing, nonlabored ventilation, respiratory function stable and patient connected to nasal cannula oxygen Cardiovascular status: blood pressure returned to baseline, stable and tachycardic Postop Assessment: no apparent nausea or vomiting Anesthetic complications: no   No complications documented.  Last Vitals:  Vitals:   05/30/20 2100 05/30/20 2116  BP: (!) 141/73 (!) 144/50  Pulse: (!) 115 (!) 124  Resp: 13 17  Temp:  37.3 C  SpO2: 97% 98%    Last Pain:  Vitals:   05/30/20 2116  TempSrc: Oral  PainSc:                  Catheryn Bacon Mycah Formica
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
## 2020-05-30 NOTE — Anesthesia Procedure Notes (Signed)
Procedure Name: Intubation Date/Time: 05/30/2020 3:01 PM Performed by: Candis Shine, CRNA Pre-anesthesia Checklist: Patient identified, Emergency Drugs available, Suction available and Patient being monitored Patient Re-evaluated:Patient Re-evaluated prior to induction Oxygen Delivery Method: Circle System Utilized Preoxygenation: Pre-oxygenation with 100% oxygen Induction Type: IV induction Ventilation: Mask ventilation without difficulty Laryngoscope Size: Mac Grade View: Grade II Tube type: Oral Tube size: 8.0 mm Number of attempts: 1 Airway Equipment and Method: Stylet Placement Confirmation: ETT inserted through vocal cords under direct vision,  positive ETCO2 and breath sounds checked- equal and bilateral Secured at: 23 cm Tube secured with: Tape Dental Injury: Teeth and Oropharynx as per pre-operative assessment

## 2020-05-30 NOTE — ED Notes (Signed)
OR called and stated they were ready for patient. Short stay called and report given. [facility] was made aware of the change.

## 2020-05-31 LAB — CBC
HCT: 28.8 % — ABNORMAL LOW (ref 39.0–52.0)
HCT: 26.9 % — ABNORMAL LOW (ref 39.0–52.0)
Hemoglobin: 9.1 g/dL — ABNORMAL LOW (ref 13.0–17.0)
Hemoglobin: 8.7 g/dL — ABNORMAL LOW (ref 13.0–17.0)
MCH: 29.4 pg (ref 26.0–34.0)
MCH: 30 pg (ref 26.0–34.0)
MCHC: 31.6 g/dL (ref 30.0–36.0)
MCHC: 32.3 g/dL (ref 30.0–36.0)
MCV: 93.2 fL (ref 80.0–100.0)
MCV: 92.8 fL (ref 80.0–100.0)
Platelets: 200 10*3/uL (ref 150–400)
Platelets: 189 10*3/uL (ref 150–400)
RBC: 3.09 MIL/uL — ABNORMAL LOW (ref 4.22–5.81)
RBC: 2.9 MIL/uL — ABNORMAL LOW (ref 4.22–5.81)
RDW: 13.1 % (ref 11.5–15.5)
RDW: 12.9 % (ref 11.5–15.5)
WBC: 12.6 10*3/uL — ABNORMAL HIGH (ref 4.0–10.5)
WBC: 11 10*3/uL — ABNORMAL HIGH (ref 4.0–10.5)
nRBC: 0 % (ref 0.0–0.2)
nRBC: 0 % (ref 0.0–0.2)

## 2020-05-31 LAB — BASIC METABOLIC PANEL
Anion gap: 10 (ref 5–15)
BUN: 8 mg/dL (ref 6–20)
CO2: 23 mmol/L (ref 22–32)
Calcium: 7.8 mg/dL — ABNORMAL LOW (ref 8.9–10.3)
Chloride: 100 mmol/L (ref 98–111)
Creatinine, Ser: 0.88 mg/dL (ref 0.61–1.24)
GFR calc Af Amer: >60 mL/min (ref >60)
GFR calc non Af Amer: >60 mL/min (ref >60)
Glucose, Bld: 157 mg/dL — ABNORMAL HIGH (ref 70–99)
Potassium: 4.1 mmol/L (ref 3.5–5.1)
Sodium: 133 mmol/L — ABNORMAL LOW (ref 135–145)

## 2020-05-31 LAB — VITAMIN D 25 HYDROXY (VIT D DEFICIENCY, FRACTURES): Vit D, 25-Hydroxy: 12.3 ng/mL — ABNORMAL LOW (ref 30–100)

## 2020-05-31 MED ORDER — ALUM & MAG HYDROXIDE-SIMETH 200-200-20 MG/5ML PO SUSP: 30.0000 mL | Freq: Four times a day (QID) | ORAL | Status: DC | PRN

## 2020-05-31 MED ORDER — MENTHOL 3 MG MT LOZG: 1.0000 | LOZENGE | OROMUCOSAL | Status: DC | PRN

## 2020-05-31 MED ORDER — MAGIC MOUTHWASH
15.0000 mL | Freq: Four times a day (QID) | ORAL | Status: DC | PRN
  Filled 2020-05-31: qty 15

## 2020-05-31 MED ORDER — PHENOL 1.4 % MT LIQD: 2.0000 | OROMUCOSAL | Status: DC | PRN

## 2020-05-31 MED ORDER — GABAPENTIN 300 MG PO CAPS
300.0000 mg | ORAL_CAPSULE | Freq: Three times a day (TID) | ORAL | Status: DC
  Administered 2020-05-31 – 2020-06-01 (×3): 300 mg via ORAL
  Filled 2020-05-31 (×3): qty 1

## 2020-05-31 MED ORDER — VITAMIN D 25 MCG (1000 UNIT) PO TABS
1000.0000 [IU] | ORAL_TABLET | Freq: Every day | ORAL | Status: DC
  Administered 2020-05-31 – 2020-06-02 (×3): 1000 [IU] via ORAL
  Filled 2020-05-31 (×3): qty 1

## 2020-05-31 MED ORDER — SODIUM CHLORIDE 0.9% FLUSH
3.0000 mL | Freq: Two times a day (BID) | INTRAVENOUS | Status: DC
  Administered 2020-05-31 – 2020-06-02 (×3): 3 mL via INTRAVENOUS

## 2020-05-31 MED ORDER — METOPROLOL TARTRATE 5 MG/5ML IV SOLN
5.0000 mg | Freq: Four times a day (QID) | INTRAVENOUS | Status: DC | PRN
  Administered 2020-05-31: 5 mg via INTRAVENOUS

## 2020-05-31 MED ORDER — LIP MEDEX EX OINT
1.0000 "application " | TOPICAL_OINTMENT | Freq: Two times a day (BID) | CUTANEOUS | Status: DC
  Administered 2020-05-31 – 2020-06-02 (×6): 1 via TOPICAL
  Filled 2020-05-31: qty 7

## 2020-05-31 MED ORDER — SODIUM CHLORIDE 0.9 % IV SOLN: 250.0000 mL | INTRAVENOUS | Status: DC | PRN

## 2020-05-31 MED ORDER — LACTATED RINGERS IV BOLUS: 1000.0000 mL | Freq: Three times a day (TID) | INTRAVENOUS | Status: AC | PRN

## 2020-05-31 MED ORDER — POLYETHYLENE GLYCOL 3350 17 G PO PACK
17.0000 g | PACK | Freq: Two times a day (BID) | ORAL | Status: DC
  Administered 2020-05-31 – 2020-06-03 (×4): 17 g via ORAL
  Filled 2020-05-31 (×7): qty 1

## 2020-05-31 MED ORDER — SODIUM CHLORIDE 0.9% FLUSH: 3.0000 mL | INTRAVENOUS | Status: DC | PRN
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
## 2020-05-31 NOTE — Progress Notes (Signed)
Incentive spirometer also encouraged
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
## 2020-05-31 NOTE — Progress Notes (Signed)
PRN meds given as ordered, will recheck V/S

## 2020-05-31 NOTE — Progress Notes (Signed)
Orthopedic Tech Progress Note Patient Details:  Jason Walter [birth year] [id number] Overhead trapeze applied to patient bed.     Post Interventions Patient Tolerated: Well Instructions Provided: Poper ambulation with device   Michelle Piper 05/31/2020, 12:33 PM

## 2020-06-01 LAB — CBC
HCT: 23.4 % — ABNORMAL LOW (ref 39.0–52.0)
HCT: 24.5 % — ABNORMAL LOW (ref 39.0–52.0)
Hemoglobin: 7.6 g/dL — ABNORMAL LOW (ref 13.0–17.0)
Hemoglobin: 8 g/dL — ABNORMAL LOW (ref 13.0–17.0)
MCH: 29.9 pg (ref 26.0–34.0)
MCH: 30.4 pg (ref 26.0–34.0)
MCHC: 32.5 g/dL (ref 30.0–36.0)
MCHC: 32.7 g/dL (ref 30.0–36.0)
MCV: 92.1 fL (ref 80.0–100.0)
MCV: 93.2 fL (ref 80.0–100.0)
Platelets: 178 10*3/uL (ref 150–400)
Platelets: 200 10*3/uL (ref 150–400)
RBC: 2.54 MIL/uL — ABNORMAL LOW (ref 4.22–5.81)
RBC: 2.63 MIL/uL — ABNORMAL LOW (ref 4.22–5.81)
RDW: 12.8 % (ref 11.5–15.5)
RDW: 12.8 % (ref 11.5–15.5)
WBC: 10.5 10*3/uL (ref 4.0–10.5)
WBC: 12 10*3/uL — ABNORMAL HIGH (ref 4.0–10.5)
nRBC: 0 % (ref 0.0–0.2)
nRBC: 0 % (ref 0.0–0.2)

## 2020-06-01 MED ORDER — LIDOCAINE 5 % EX PTCH
1.0000 | MEDICATED_PATCH | CUTANEOUS | Status: DC
  Administered 2020-06-01 – 2020-06-03 (×3): 1 via TRANSDERMAL
  Filled 2020-06-01 (×3): qty 1

## 2020-06-01 MED ORDER — SIMETHICONE 40 MG/0.6ML PO SUSP
40.0000 mg | Freq: Four times a day (QID) | ORAL | Status: DC | PRN
  Filled 2020-06-01: qty 0.6

## 2020-06-01 MED ORDER — LACTATED RINGERS IV BOLUS: 1000.0000 mL | Freq: Three times a day (TID) | INTRAVENOUS | Status: AC | PRN

## 2020-06-01 MED ORDER — SODIUM CHLORIDE 0.9 % IV SOLN
500.0000 mg | Freq: Once | INTRAVENOUS | Status: AC
  Administered 2020-06-01: 500 mg via INTRAVENOUS
  Filled 2020-06-01: qty 10

## 2020-06-01 MED ORDER — METHOCARBAMOL 500 MG PO TABS
1000.0000 mg | ORAL_TABLET | Freq: Four times a day (QID) | ORAL | Status: DC
  Administered 2020-06-01 – 2020-06-03 (×9): 1000 mg via ORAL
  Filled 2020-06-01 (×9): qty 2

## 2020-06-01 MED ORDER — GABAPENTIN 400 MG PO CAPS
400.0000 mg | ORAL_CAPSULE | Freq: Three times a day (TID) | ORAL | Status: DC
  Administered 2020-06-01 – 2020-06-03 (×6): 400 mg via ORAL
  Filled 2020-06-01 (×6): qty 1

## 2020-06-01 MED ORDER — SODIUM CHLORIDE 0.9 % IV SOLN
25.0000 mg | Freq: Once | INTRAVENOUS | Status: AC
  Administered 2020-06-01: 25 mg via INTRAVENOUS
  Filled 2020-06-01: qty 0.5

## 2020-06-01 NOTE — Progress Notes (Signed)
Patient's HR remained between 108 and 113 tonight.  HR rate went up to 133 when getting up to the chair.  HR returned to 108 when returned to chair.  HR has remained stable with out any intervention this shift.  No prn's given due to not meeting prn parameters.  Patient had an uneventful night.  MEWS has now turned Green.  Next Vital sign are scheduled for 8:30 am. Will report to on-coming nurse.
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
## 2020-06-01 NOTE — Progress Notes (Signed)
Subjective: 2 Days Post-Op s/p Procedure(s): OPEN REDUCTION INTERNAL FIXATION ACETABULUM FRACTURE POSTERIOR  Sitting up in recliner. Pain improving. No other complaints  Objective:  PE: VITALS:   Vitals:   05/31/20 1830 05/31/20 2044 06/01/20 0027 06/01/20 0433  BP: (!) 125/40 (!) 118/58 (!) 144/55 (!) 142/65  Pulse: (!) 132 (!) 111 (!) 113 (!) 108  Resp: 18 17 18 17   Temp: 100.1 F (37.8 C) 100.1 F (37.8 C) 99.4 F (37.4 C) 99.5 F (37.5 C)  TempSrc: Oral Oral Oral Oral  SpO2: 99% 97% 98% 99%  Weight:      Height:       Gen: Alert, oriented, in no acute distress. Sitting up in recliner MSK: Operative dressing removed. Incision CDI. New Mepilex dressing placed over incisions. TTP to right lateral thigh and groin. Warm well perfused foot.   LABS  Results for orders placed or performed during the hospital encounter of 05/29/20 (from the past 24 hour(s))  CBC     Status: Abnormal   Collection Time: 06/01/20  1:48 AM  Result Value Ref Range   WBC 10.5 4.0 - 10.5 K/uL   RBC 2.54 (L) 4.22 - 5.81 MIL/uL   Hemoglobin 7.6 (L) 13.0 - 17.0 g/dL   HCT 08/02/20 (L) 39 - 52 %   MCV 92.1 80.0 - 100.0 fL   MCH 29.9 26.0 - 34.0 pg   MCHC 32.5 30.0 - 36.0 g/dL   RDW 01.7 49.4 - 49.6 %   Platelets 178 150 - 400 K/uL   nRBC 0.0 0.0 - 0.2 %    DG Pelvis Comp Min 3V  Result Date: 05/30/2020 CLINICAL DATA:  ORIF acetabular fracture EXAM: JUDET PELVIS - 3+ VIEW COMPARISON:  Intraoperative radiograph 05/30/2020, CT 05/29/2020 FINDINGS: Interval placement of a partially threaded cannulated screw traversing the right ilium extending into the superior pubic ramus. Additional plate and screw constructs are seen along the posterolateral aspect of the acetabulum. No acute hardware complication. Femoral head is normally located. No other acute osseous injuries are identified. Expected postsurgical soft tissue changes seen laterally. IMPRESSION: Interval ORIF of right acetabular fractures, as  described above. No evidence of acute hardware complication. Electronically Signed   By: 07/30/2020 M.D.   On: 05/30/2020 20:59   DG Pelvis Comp Min 3V  Result Date: 05/30/2020 CLINICAL DATA:  ORIF posterior acetabulum EXAM: DG C-ARM 1-60 MIN; JUDET PELVIS - 3+ VIEW COMPARISON:  05/29/2020 FINDINGS: Multiple intraoperative images demonstrate internal fixation across the acetabular fracture. Final images demonstrate near anatomic alignment. No visible complicating feature. IMPRESSION: Internal fixation across the right acetabular fracture without visible complicating feature. Electronically Signed   By: 07/30/2020 M.D.   On: 05/30/2020 19:41   CT HIP RIGHT WO CONTRAST  Result Date: 05/31/2020 CLINICAL DATA:  Right acetabular fracture status post ORIF. EXAM: CT OF THE RIGHT HIP WITHOUT CONTRAST TECHNIQUE: Multidetector CT imaging of the right hip was performed according to the standard protocol. Multiplanar CT image reconstructions were also generated. COMPARISON:  Pelvic x-rays from same day. CT abdomen pelvis from yesterday. FINDINGS: Bones/Joint/Cartilage Interval plate and screw fixation of the complex, comminuted right acetabular fracture, now in anatomic alignment. No dislocation. Small screw tract in the intertrochanteric femur. No joint effusion. Ligaments Ligaments are suboptimally evaluated by CT. Muscles and Tendons Grossly intact.  No muscle atrophy. Soft tissue Scattered foci of subcutaneous emphysema overlying the right hip. Small superficial postoperative hematoma overlying the right hip, measuring 2.0 x 3.6 x  13.2 cm. No soft tissue mass. IMPRESSION: 1. Interval ORIF of the complex, comminuted right acetabular fracture, now in anatomic alignment. 2. Postsurgical changes and small superficial postoperative hematoma overlying the right hip. Electronically Signed   By: Obie Dredge M.D.   On: 05/31/2020 09:21   DG C-Arm 1-60 Min  Result Date: 05/30/2020 CLINICAL DATA:  ORIF posterior  acetabulum EXAM: DG C-ARM 1-60 MIN; JUDET PELVIS - 3+ VIEW COMPARISON:  05/29/2020 FINDINGS: Multiple intraoperative images demonstrate internal fixation across the acetabular fracture. Final images demonstrate near anatomic alignment. No visible complicating feature. IMPRESSION: Internal fixation across the right acetabular fracture without visible complicating feature. Electronically Signed   By: Charlett Nose M.D.   On: 05/30/2020 19:41    Assessment/Plan:  Right transverse-posterior wall acetabula fracture, right closed hip dislocation, right sciatic nerve palsy 2 Days Post-Op s/p Procedure(s):OPEN REDUCTION INTERNAL FIXATION ACETABULUM FRACTURE POSTERIOR  - Vitamin D level 12.3, started on vitamin D supplementation - ABL anemia: started on IV iron. Hgb 7.6 today. Repeat CBC ordered for later today.   Weightbearing: TDWB RLE, PT/OT Insicional and dressing care: Reinforce dressings as needed Dressing changed today.  VTE prophylaxis: lovenox if Hbg remains stable (may hold if hemoglobin lowers), will be discharged on lovenox Pain control: continue current regimen Follow - up plan: Follow up with Dr. Jena Gauss after discharge Dispo: recommending outpatient PT  Contact information:   After hours and holidays please check [email or web address] for group call information for [facility]   Jason Walter 06/01/2020, 12:04 PM

## 2020-06-01 NOTE — Progress Notes (Addendum)
   06/01/20 2109  Assess: MEWS Score  Temp 99.7 F (37.6 C)  BP (!) 141/61  Pulse Rate (!) 117  Resp 17  SpO2 100 %  O2 Device Room Air  Assess: MEWS Score  MEWS Temp 0  MEWS Systolic 0  MEWS Pulse 2  MEWS RR 0  MEWS LOC 0  MEWS Score 2  MEWS Score Color Yellow  Assess: if the MEWS score is Yellow or Red  Were vital signs taken at a resting state? Yes  Focused Assessment Documented focused assessment  Early Detection of Sepsis Score *See Row Information* Low  MEWS guidelines implemented *See Row Information* Yes  Treat  MEWS Interventions Administered prn meds/treatments  Take Vital Signs  Increase Vital Sign Frequency  Yellow: Q 2hr X 2 then Q 4hr X 2, if remains yellow, continue Q 4hrs  Escalate  MEWS: Escalate Yellow: discuss with charge nurse/RN and consider discussing with provider and RRT  Notify: Charge Nurse/RN  Name of Charge Nurse/RN Notified Lynnell Grain  Date Charge Nurse/RN Notified 06/01/20  Time Charge Nurse/RN Notified 2215  Notify: Provider  Provider Name/Title Dr Janee Morn  Date Provider Notified 06/01/20  Time Provider Notified 2110  Notification Type Page  Notification Reason Other (Comment) (yellow MEWS d/t elevated pulse)
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
## 2020-06-02 ENCOUNTER — Encounter (HOSPITAL_COMMUNITY): Payer: Self-pay

## 2020-06-02 ENCOUNTER — Other Ambulatory Visit: Payer: Self-pay

## 2020-06-02 LAB — CBC
HCT: 22.5 % — ABNORMAL LOW (ref 39.0–52.0)
Hemoglobin: 7.3 g/dL — ABNORMAL LOW (ref 13.0–17.0)
MCH: 30.2 pg (ref 26.0–34.0)
MCHC: 32.4 g/dL (ref 30.0–36.0)
MCV: 93 fL (ref 80.0–100.0)
Platelets: 207 10*3/uL (ref 150–400)
RBC: 2.42 MIL/uL — ABNORMAL LOW (ref 4.22–5.81)
RDW: 12.6 % (ref 11.5–15.5)
WBC: 10 10*3/uL (ref 4.0–10.5)
nRBC: 0 % (ref 0.0–0.2)

## 2020-06-02 LAB — TYPE AND SCREEN
ABO/RH(D): O POS
Antibody Screen: NEGATIVE
Unit division: 0
Unit division: 0

## 2020-06-02 LAB — BPAM RBC
Blood Product Expiration Date: 202108052359
Blood Product Expiration Date: 202108052359
Unit Type and Rh: 5100
Unit Type and Rh: 5100

## 2020-06-02 LAB — PREPARE RBC (CROSSMATCH)

## 2020-06-02 LAB — HEMOGLOBIN AND HEMATOCRIT, BLOOD
HCT: 24.7 % — ABNORMAL LOW (ref 39.0–52.0)
Hemoglobin: 8.2 g/dL — ABNORMAL LOW (ref 13.0–17.0)

## 2020-06-02 MED ORDER — SODIUM CHLORIDE 0.9% IV SOLUTION: Freq: Once | INTRAVENOUS | Status: AC

## 2020-06-02 MED FILL — Tobramycin Sulfate For Inj 1.2 GM: INTRAMUSCULAR | Qty: 1.2 | Status: AC
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
## 2020-06-02 NOTE — Progress Notes (Signed)
     Subjective: 3 Days Post-Op s/p Procedure(s): OPEN REDUCTION INTERNAL FIXATION ACETABULUM FRACTURE POSTERIOR  Sleeping in hospital bed. Still getting tachycardic with ambulation.   Objective:  PE: VITALS:   Vitals:   06/01/20 2300 06/02/20 0101 06/02/20 0512 06/02/20 0931  BP: (!) 133/59 124/65 (!) 120/49 (!) 124/49  Pulse: 100 (!) 103 (!) 102 91  Resp: 17 17 18 16   Temp: 99.1 F (37.3 C) 98.8 F (37.1 C) 100.2 F (37.9 C) 98.2 F (36.8 C)  TempSrc: Oral Oral Oral Oral  SpO2: 98% 99% 98% 100%  Weight:      Height:       Gen: Alert, oriented, in no acute distress. Resting in hospital bed MSK: Dressing CDI. TTP to right lateral thigh and groin. TTP right knee. Endorses distal sensation, but continues to report numbness in big toe. intact EHL/TA/GSC. Warm well perfused foot.    LABS  Results for orders placed or performed during the hospital encounter of 05/29/20 (from the past 24 hour(s))  CBC     Status: Abnormal   Collection Time: 06/01/20  1:22 PM  Result Value Ref Range   WBC 12.0 (H) 4.0 - 10.5 K/uL   RBC 2.63 (L) 4.22 - 5.81 MIL/uL   Hemoglobin 8.0 (L) 13.0 - 17.0 g/dL   HCT 08/02/20 (L) 39 - 52 %   MCV 93.2 80.0 - 100.0 fL   MCH 30.4 26.0 - 34.0 pg   MCHC 32.7 30.0 - 36.0 g/dL   RDW 50.0 93.8 - 18.2 %   Platelets 200 150 - 400 K/uL   nRBC 0.0 0.0 - 0.2 %  CBC     Status: Abnormal   Collection Time: 06/02/20  2:28 AM  Result Value Ref Range   WBC 10.0 4.0 - 10.5 K/uL   RBC 2.42 (L) 4.22 - 5.81 MIL/uL   Hemoglobin 7.3 (L) 13.0 - 17.0 g/dL   HCT 08/03/20 (L) 39 - 52 %   MCV 93.0 80.0 - 100.0 fL   MCH 30.2 26.0 - 34.0 pg   MCHC 32.4 30.0 - 36.0 g/dL   RDW 71.6 96.7 - 89.3 %   Platelets 207 150 - 400 K/uL   nRBC 0.0 0.0 - 0.2 %    No results found.  Assessment/Plan:  Right transverse-posterior wall acetabula fracture, right closed hip dislocation, right sciatic nerve palsy 3 Days Post-Op s/p Procedure(s):OPEN REDUCTION INTERNAL FIXATION ACETABULUM  FRACTURE POSTERIOR  - Vitamin D level 12.3, started on vitamin D supplementation - ABL anemia: started on IV iron. Hgb 7.3 today. Receiving 1u pRBC today. Repeat CBC this afternoon. Patient continues to get tachycardic and dizzy with ambulation.  Weightbearing: TDWB RLE, PT/OT Insicional and dressing care: Reinforce dressings as needed Dressing changed 06/01/2020.  VTE prophylaxis: lovenox if Hbg remains stable (may hold if hemoglobin lowers), will be discharged on lovenox Pain control: continue current regimen Follow - up plan: Follow up with Dr. 08/02/2020 after discharge Dispo: recommending outpatient PT  Contact information:   After hours and holidays please check [email or web address] for group call information for [facility]   Jena Gauss 06/02/2020, 9:33 AM
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
## 2020-06-03 ENCOUNTER — Encounter (HOSPITAL_COMMUNITY): Payer: Self-pay | Admitting: Student

## 2020-06-03 LAB — POCT I-STAT 7, (LYTES, BLD GAS, ICA,H+H)
Acid-Base Excess: 1 mmol/L (ref 0.0–2.0)
Bicarbonate: 26.8 mmol/L (ref 20.0–28.0)
Calcium, Ion: 0.93 mmol/L — ABNORMAL LOW (ref 1.15–1.40)
HCT: 33 % — ABNORMAL LOW (ref 39.0–52.0)
Hemoglobin: 11.2 g/dL — ABNORMAL LOW (ref 13.0–17.0)
O2 Saturation: 90 %
Potassium: 7.9 mmol/L (ref 3.5–5.1)
Sodium: 135 mmol/L (ref 135–145)
TCO2: 28 mmol/L (ref 22–32)
pCO2 arterial: 46 mmHg (ref 32.0–48.0)
pH, Arterial: 7.374 (ref 7.350–7.450)
pO2, Arterial: 60 mmHg — ABNORMAL LOW (ref 83.0–108.0)

## 2020-06-03 LAB — CBC
HCT: 23.6 % — ABNORMAL LOW (ref 39.0–52.0)
Hemoglobin: 7.8 g/dL — ABNORMAL LOW (ref 13.0–17.0)
MCH: 30.5 pg (ref 26.0–34.0)
MCHC: 33.1 g/dL (ref 30.0–36.0)
MCV: 92.2 fL (ref 80.0–100.0)
Platelets: 248 10*3/uL (ref 150–400)
RBC: 2.56 MIL/uL — ABNORMAL LOW (ref 4.22–5.81)
RDW: 13.3 % (ref 11.5–15.5)
WBC: 9.3 10*3/uL (ref 4.0–10.5)
nRBC: 0.2 % (ref 0.0–0.2)

## 2020-06-03 LAB — TYPE AND SCREEN
ABO/RH(D): O POS
Antibody Screen: NEGATIVE
Unit division: 0

## 2020-06-03 LAB — BPAM RBC
Blood Product Expiration Date: 202107102359
ISSUE DATE / TIME: 202107051358
Unit Type and Rh: 5100

## 2020-06-03 MED ORDER — ENOXAPARIN SODIUM 40 MG/0.4ML ~~LOC~~ SOLN: 40.0000 mg | SUBCUTANEOUS | 0 refills | Status: DC

## 2020-06-03 MED ORDER — FERROUS SULFATE 325 (65 FE) MG PO TABS: 325.0000 mg | ORAL_TABLET | Freq: Two times a day (BID) | ORAL | 0 refills | Status: DC

## 2020-06-03 MED ORDER — OXYCODONE HCL 10 MG PO TABS: 10.0000 mg | ORAL_TABLET | Freq: Four times a day (QID) | ORAL | 0 refills | Status: DC | PRN

## 2020-06-03 MED ORDER — VITAMIN D 25 MCG (1000 UNIT) PO TABS
2000.0000 [IU] | ORAL_TABLET | Freq: Every day | ORAL | Status: DC
  Administered 2020-06-03: 2000 [IU] via ORAL
  Filled 2020-06-03: qty 2

## 2020-06-03 MED ORDER — FERROUS SULFATE 325 (65 FE) MG PO TABS
325.0000 mg | ORAL_TABLET | Freq: Two times a day (BID) | ORAL | Status: DC
  Administered 2020-06-03: 325 mg via ORAL
  Filled 2020-06-03: qty 1

## 2020-06-03 MED ORDER — HYDROMORPHONE HCL 1 MG/ML IJ SOLN: 0.5000 mg | Freq: Four times a day (QID) | INTRAMUSCULAR | Status: DC | PRN

## 2020-06-03 MED ORDER — VITAMIN D3 25 MCG PO TABS: 2000.0000 [IU] | ORAL_TABLET | Freq: Every day | ORAL | 0 refills | Status: AC

## 2020-06-03 MED ORDER — ASCORBIC ACID 500 MG PO TABS: 500.0000 mg | ORAL_TABLET | Freq: Two times a day (BID) | ORAL | 0 refills | Status: AC

## 2020-06-03 MED ORDER — BISACODYL 10 MG RE SUPP: 10.0000 mg | Freq: Once | RECTAL | Status: DC

## 2020-06-03 MED ORDER — GABAPENTIN 400 MG PO CAPS: 400.0000 mg | ORAL_CAPSULE | Freq: Three times a day (TID) | ORAL | 0 refills | Status: AC

## 2020-06-03 MED ORDER — ACETAMINOPHEN 500 MG PO TABS: 1000.0000 mg | ORAL_TABLET | Freq: Four times a day (QID) | ORAL | 0 refills | Status: DC | PRN

## 2020-06-03 MED ORDER — ASCORBIC ACID 500 MG PO TABS
500.0000 mg | ORAL_TABLET | Freq: Two times a day (BID) | ORAL | Status: DC
  Administered 2020-06-03: 500 mg via ORAL
  Filled 2020-06-03: qty 1

## 2020-06-03 MED ORDER — POLYETHYLENE GLYCOL 3350 17 G PO PACK: 17.0000 g | PACK | Freq: Every day | ORAL | 0 refills | Status: DC | PRN

## 2020-06-03 MED ORDER — METHOCARBAMOL 500 MG PO TABS: 500.0000 mg | ORAL_TABLET | Freq: Three times a day (TID) | ORAL | 0 refills | Status: DC | PRN
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
## 2020-06-03 NOTE — Plan of Care (Signed)
  Problem: Education: Goal: Verbalization of understanding the information provided (i.e., activity precautions, restrictions, etc) will improve Outcome: Adequate for Discharge Goal: Individualized Educational Video(s) Outcome: Adequate for Discharge   Problem: Activity: Goal: Ability to ambulate and perform ADLs will improve Outcome: Adequate for Discharge   Problem: Clinical Measurements: Goal: Postoperative complications will be avoided or minimized Outcome: Adequate for Discharge   Problem: Self-Concept: Goal: Ability to maintain and perform role responsibilities to the fullest extent possible will improve Outcome: Adequate for Discharge   Problem: Pain Management: Goal: Pain level will decrease Outcome: Adequate for Discharge   

## 2020-06-03 NOTE — Progress Notes (Signed)
Jason Walter to be D/C'd  per MD order. Discussed with the patient and all questions fully answered.  VSS, Skin clean, dry and intact without evidence of skin break down, no evidence of skin tears noted.  IV catheter discontinued intact. Site without signs and symptoms of complications. Dressing and pressure applied.  An After Visit Summary was printed and given to the patient. Patient received prescription.  D/c education completed with patient/family including follow up instructions, medication list, d/c activities limitations if indicated, with other d/c instructions as indicated by MD - patient able to verbalize understanding, all questions fully answered.   Patient instructed to return to ED, call 911, or call MD for any changes in condition.   Patient to be escorted via WC, and D/C home via private auto.
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
## 2020-06-03 NOTE — TOC Transition Note (Signed)
Transition of Care [facility]) - CM/SW Discharge Note   Patient Details  Name: Jason Walter MRN: [id number] Date of Birth: 03-Oct-1986  Transition of Care [facility]) CM/SW Contact:  Glennon Mac, RN Phone Number: 06/03/2020, 2:37 PM   Clinical Narrative:  Pt for possible discharge later today; recommended DME in room.  Added tub bench to DME today, as pt states he would like one.  OT concerned as she states WC does not have anti-tippers on back.  Checked with DME company, [employer]; currently out of anti-tippers, but will be drop-shipping them to patient when they become available.  Referral to [facility] for continue OP physical therapy.       Final next level of care: OP Rehab Barriers to Discharge: Barriers Resolved                       Discharge Plan and Services   Discharge Planning Services: CM Consult            DME Arranged: 3-N-1, Walker rolling, Wheelchair manual, Tub bench DME Agency: [employer] Date DME Agency Contacted: 06/03/20 Time DME Agency Contacted: 1218 Representative spoke with at DME Agency: Oletha Cruel            Social Determinants of Health (SDOH) Interventions     Readmission Risk Interventions No flowsheet data found.  Quintella Baton, RN, BSN  Trauma/Neuro ICU Case Manager [phone]

## 2020-06-22 ENCOUNTER — Encounter (HOSPITAL_COMMUNITY): Payer: Self-pay

## 2020-06-22 ENCOUNTER — Emergency Department (HOSPITAL_COMMUNITY)
Admission: EM | Admit: 2020-06-22 | Discharge: 2020-06-22 | Disposition: A | Discharge status: Home / Self Care | Payer: No Typology Code available for payment source | Attending: Emergency Medicine | Admitting: Emergency Medicine

## 2020-06-22 ENCOUNTER — Other Ambulatory Visit: Payer: Self-pay

## 2020-06-22 DIAGNOSIS — T7840XA Allergy, unspecified, initial encounter: Secondary | ICD-10-CM | POA: Insufficient documentation

## 2020-06-22 DIAGNOSIS — L299 Pruritus, unspecified: Secondary | ICD-10-CM | POA: Insufficient documentation

## 2020-06-22 DIAGNOSIS — M2540 Effusion, unspecified joint: Secondary | ICD-10-CM | POA: Insufficient documentation

## 2020-06-22 DIAGNOSIS — R2233 Localized swelling, mass and lump, upper limb, bilateral: Secondary | ICD-10-CM | POA: Diagnosis present

## 2020-06-22 DIAGNOSIS — M545 Low back pain: Secondary | ICD-10-CM | POA: Diagnosis not present

## 2020-06-22 DIAGNOSIS — F17210 Nicotine dependence, cigarettes, uncomplicated: Secondary | ICD-10-CM | POA: Insufficient documentation

## 2020-06-22 LAB — CBC
HCT: 35.3 % — ABNORMAL LOW (ref 39.0–52.0)
Hemoglobin: 11.2 g/dL — ABNORMAL LOW (ref 13.0–17.0)
MCH: 28.9 pg (ref 26.0–34.0)
MCHC: 31.7 g/dL (ref 30.0–36.0)
MCV: 91.2 fL (ref 80.0–100.0)
Platelets: 411 10*3/uL — ABNORMAL HIGH (ref 150–400)
RBC: 3.87 MIL/uL — ABNORMAL LOW (ref 4.22–5.81)
RDW: 13.9 % (ref 11.5–15.5)
WBC: 8.3 10*3/uL (ref 4.0–10.5)
nRBC: 0 % (ref 0.0–0.2)

## 2020-06-22 LAB — URINALYSIS, ROUTINE W REFLEX MICROSCOPIC
Bilirubin Urine: NEGATIVE
Glucose, UA: NEGATIVE mg/dL
Hgb urine dipstick: NEGATIVE
Ketones, ur: NEGATIVE mg/dL
Leukocytes,Ua: NEGATIVE
Nitrite: NEGATIVE
Protein, ur: NEGATIVE mg/dL
Specific Gravity, Urine: 1.027 (ref 1.005–1.030)
pH: 5 (ref 5.0–8.0)

## 2020-06-22 LAB — BASIC METABOLIC PANEL
Anion gap: 10 (ref 5–15)
BUN: 11 mg/dL (ref 6–20)
CO2: 24 mmol/L (ref 22–32)
Calcium: 9.4 mg/dL (ref 8.9–10.3)
Chloride: 103 mmol/L (ref 98–111)
Creatinine, Ser: 0.74 mg/dL (ref 0.61–1.24)
GFR calc Af Amer: >60 mL/min (ref >60)
GFR calc non Af Amer: >60 mL/min (ref >60)
Glucose, Bld: 121 mg/dL — ABNORMAL HIGH (ref 70–99)
Potassium: 4 mmol/L (ref 3.5–5.1)
Sodium: 137 mmol/L (ref 135–145)

## 2020-06-22 MED ORDER — DIPHENHYDRAMINE HCL 50 MG/ML IJ SOLN
25.0000 mg | Freq: Once | INTRAMUSCULAR | Status: AC
  Administered 2020-06-22: 25 mg via INTRAVENOUS
  Filled 2020-06-22: qty 1

## 2020-06-22 MED ORDER — DIPHENHYDRAMINE HCL 50 MG PO TABS
50.0000 mg | ORAL_TABLET | Freq: Every evening | ORAL | 0 refills | Status: DC | PRN
Start: 2020-06-22 — End: 2020-06-28

## 2020-06-22 MED ORDER — FAMOTIDINE IN NACL 20-0.9 MG/50ML-% IV SOLN
20.0000 mg | Freq: Once | INTRAVENOUS | Status: AC
  Administered 2020-06-22: 20 mg via INTRAVENOUS
  Filled 2020-06-22: qty 50

## 2020-06-22 MED ORDER — METHYLPREDNISOLONE SODIUM SUCC 125 MG IJ SOLR
125.0000 mg | Freq: Once | INTRAMUSCULAR | Status: AC
  Administered 2020-06-22: 125 mg via INTRAVENOUS
  Filled 2020-06-22: qty 2
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
## 2020-06-22 NOTE — Discharge Instructions (Addendum)
Please pick up benadryl and take as prescribed. Your symptoms may be related to an allergic reaction of some kind. I would recommend washing all of your bedsheets and towels as well.   Follow up with your PCP regarding your ED visit today  Continue taking your medications as prescribed  Return to the ED for any worsening symptoms

## 2020-06-25 ENCOUNTER — Encounter (HOSPITAL_COMMUNITY): Payer: Self-pay | Admitting: Emergency Medicine

## 2020-06-25 ENCOUNTER — Ambulatory Visit (INDEPENDENT_AMBULATORY_CARE_PROVIDER_SITE_OTHER)
Admission: EM | Admit: 2020-06-25 | Discharge: 2020-06-25 | Disposition: A | Discharge status: Home / Self Care | Payer: No Typology Code available for payment source | Source: Home / Self Care | Attending: Family Medicine | Admitting: Family Medicine

## 2020-06-25 ENCOUNTER — Inpatient Hospital Stay (HOSPITAL_COMMUNITY)
Admission: EM | Admit: 2020-06-25 | Discharge: 2020-06-28 | DRG: 301 | Disposition: A | Discharge status: Home / Self Care | Payer: No Typology Code available for payment source | Attending: Student in an Organized Health Care Education/Training Program | Admitting: Student in an Organized Health Care Education/Training Program

## 2020-06-25 ENCOUNTER — Other Ambulatory Visit: Payer: Self-pay

## 2020-06-25 DIAGNOSIS — I82C11 Acute embolism and thrombosis of right internal jugular vein: Principal | ICD-10-CM | POA: Diagnosis present

## 2020-06-25 DIAGNOSIS — Z833 Family history of diabetes mellitus: Secondary | ICD-10-CM

## 2020-06-25 DIAGNOSIS — M79631 Pain in right forearm: Secondary | ICD-10-CM

## 2020-06-25 DIAGNOSIS — M7989 Other specified soft tissue disorders: Secondary | ICD-10-CM

## 2020-06-25 DIAGNOSIS — R202 Paresthesia of skin: Secondary | ICD-10-CM

## 2020-06-25 DIAGNOSIS — Z87891 Personal history of nicotine dependence: Secondary | ICD-10-CM

## 2020-06-25 DIAGNOSIS — R609 Edema, unspecified: Secondary | ICD-10-CM

## 2020-06-25 DIAGNOSIS — Z7902 Long term (current) use of antithrombotics/antiplatelets: Secondary | ICD-10-CM

## 2020-06-25 DIAGNOSIS — D649 Anemia, unspecified: Secondary | ICD-10-CM | POA: Diagnosis present

## 2020-06-25 DIAGNOSIS — H532 Diplopia: Secondary | ICD-10-CM | POA: Diagnosis present

## 2020-06-25 DIAGNOSIS — S32039D Unspecified fracture of third lumbar vertebra, subsequent encounter for fracture with routine healing: Secondary | ICD-10-CM

## 2020-06-25 DIAGNOSIS — Z20822 Contact with and (suspected) exposure to covid-19: Secondary | ICD-10-CM | POA: Diagnosis present

## 2020-06-25 DIAGNOSIS — T783XXA Angioneurotic edema, initial encounter: Secondary | ICD-10-CM | POA: Diagnosis present

## 2020-06-25 DIAGNOSIS — N5089 Other specified disorders of the male genital organs: Secondary | ICD-10-CM | POA: Diagnosis present

## 2020-06-25 DIAGNOSIS — Z79899 Other long term (current) drug therapy: Secondary | ICD-10-CM

## 2020-06-25 DIAGNOSIS — S32401D Unspecified fracture of right acetabulum, subsequent encounter for fracture with routine healing: Secondary | ICD-10-CM

## 2020-06-25 DIAGNOSIS — I82409 Acute embolism and thrombosis of unspecified deep veins of unspecified lower extremity: Secondary | ICD-10-CM | POA: Diagnosis present

## 2020-06-25 LAB — COMPREHENSIVE METABOLIC PANEL
ALT: 23 U/L (ref 0–44)
AST: 18 U/L (ref 15–41)
Albumin: 4 g/dL (ref 3.5–5.0)
Alkaline Phosphatase: 75 U/L (ref 38–126)
Anion gap: 11 (ref 5–15)
BUN: 11 mg/dL (ref 6–20)
CO2: 25 mmol/L (ref 22–32)
Calcium: 9.4 mg/dL (ref 8.9–10.3)
Chloride: 100 mmol/L (ref 98–111)
Creatinine, Ser: 0.76 mg/dL (ref 0.61–1.24)
GFR calc Af Amer: >60 mL/min (ref >60)
GFR calc non Af Amer: >60 mL/min (ref >60)
Glucose, Bld: 103 mg/dL — ABNORMAL HIGH (ref 70–99)
Potassium: 3.7 mmol/L (ref 3.5–5.1)
Sodium: 136 mmol/L (ref 135–145)
Total Bilirubin: 0.4 mg/dL (ref 0.3–1.2)
Total Protein: 7.1 g/dL (ref 6.5–8.1)

## 2020-06-25 LAB — CBC WITH DIFFERENTIAL/PLATELET
Abs Immature Granulocytes: 0.03 10*3/uL (ref 0.00–0.07)
Basophils Absolute: 0 10*3/uL (ref 0.0–0.1)
Basophils Relative: 0 %
Eosinophils Absolute: 0 10*3/uL (ref 0.0–0.5)
Eosinophils Relative: 0 %
HCT: 35.9 % — ABNORMAL LOW (ref 39.0–52.0)
Hemoglobin: 11 g/dL — ABNORMAL LOW (ref 13.0–17.0)
Immature Granulocytes: 0 %
Lymphocytes Relative: 27 %
Lymphs Abs: 2.2 10*3/uL (ref 0.7–4.0)
MCH: 28.4 pg (ref 26.0–34.0)
MCHC: 30.6 g/dL (ref 30.0–36.0)
MCV: 92.5 fL (ref 80.0–100.0)
Monocytes Absolute: 0.6 10*3/uL (ref 0.1–1.0)
Monocytes Relative: 8 %
Neutro Abs: 5.3 10*3/uL (ref 1.7–7.7)
Neutrophils Relative %: 65 %
Platelets: 352 10*3/uL (ref 150–400)
RBC: 3.88 MIL/uL — ABNORMAL LOW (ref 4.22–5.81)
RDW: 13.7 % (ref 11.5–15.5)
WBC: 8.1 10*3/uL (ref 4.0–10.5)
nRBC: 0 % (ref 0.0–0.2)

## 2020-06-25 NOTE — ED Triage Notes (Signed)
Pt arrives to ED with reoccurrence of right hand and forearm swelling. Pt states he was sen here 2 days for this and was told it was allergic reaction and it did resolved however when he woke up this morning swelling was back.

## 2020-06-25 NOTE — ED Notes (Signed)
Patient is being discharged from the Urgent Care and sent to the Emergency Department via private vehicle . Per dr hagler, patient is in need of higher level of care due to arm swelling/numbness. Patient is aware and verbalizes understanding of plan of care.  Vitals:   06/25/20 1949  BP: (!) 145/49  Pulse: (!) 119  Resp: 20  Temp: 99 F (37.2 C)  SpO2: 100%
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
## 2020-06-26 ENCOUNTER — Encounter (HOSPITAL_COMMUNITY): Payer: No Typology Code available for payment source

## 2020-06-26 ENCOUNTER — Emergency Department (HOSPITAL_COMMUNITY): Payer: No Typology Code available for payment source

## 2020-06-26 ENCOUNTER — Other Ambulatory Visit (HOSPITAL_COMMUNITY): Payer: No Typology Code available for payment source

## 2020-06-26 ENCOUNTER — Observation Stay (HOSPITAL_COMMUNITY): Payer: No Typology Code available for payment source

## 2020-06-26 ENCOUNTER — Emergency Department (HOSPITAL_BASED_OUTPATIENT_CLINIC_OR_DEPARTMENT_OTHER): Payer: No Typology Code available for payment source

## 2020-06-26 ENCOUNTER — Encounter (HOSPITAL_COMMUNITY): Payer: Self-pay | Admitting: Internal Medicine

## 2020-06-26 DIAGNOSIS — I82621 Acute embolism and thrombosis of deep veins of right upper extremity: Secondary | ICD-10-CM | POA: Diagnosis not present

## 2020-06-26 DIAGNOSIS — M7989 Other specified soft tissue disorders: Secondary | ICD-10-CM

## 2020-06-26 DIAGNOSIS — R601 Generalized edema: Secondary | ICD-10-CM | POA: Diagnosis not present

## 2020-06-26 DIAGNOSIS — H538 Other visual disturbances: Secondary | ICD-10-CM

## 2020-06-26 DIAGNOSIS — I82409 Acute embolism and thrombosis of unspecified deep veins of unspecified lower extremity: Secondary | ICD-10-CM | POA: Diagnosis present

## 2020-06-26 LAB — HEPARIN LEVEL (UNFRACTIONATED): Heparin Unfractionated: 0.33 IU/mL (ref 0.30–0.70)

## 2020-06-26 LAB — C-REACTIVE PROTEIN: CRP: 3.3 mg/dL — ABNORMAL HIGH (ref <1.0)

## 2020-06-26 LAB — D-DIMER, QUANTITATIVE: D-Dimer, Quant: 4.5 ug/mL-FEU — ABNORMAL HIGH (ref 0.00–0.50)

## 2020-06-26 LAB — SEDIMENTATION RATE: Sed Rate: 20 mm/hr — ABNORMAL HIGH (ref 0–16)

## 2020-06-26 LAB — HIV ANTIBODY (ROUTINE TESTING W REFLEX): HIV Screen 4th Generation wRfx: NONREACTIVE

## 2020-06-26 LAB — SARS CORONAVIRUS 2 BY RT PCR (DIASORIN): SARS Coronavirus 2: NEGATIVE

## 2020-06-26 MED ORDER — POLYETHYLENE GLYCOL 3350 17 G PO PACK: 17.0000 g | PACK | Freq: Every day | ORAL | Status: DC | PRN

## 2020-06-26 MED ORDER — HEPARIN (PORCINE) 25000 UT/250ML-% IV SOLN
1800.0000 [IU]/h | INTRAVENOUS | Status: DC
  Administered 2020-06-26: 1600 [IU]/h via INTRAVENOUS
  Administered 2020-06-27: 1650 [IU]/h via INTRAVENOUS
  Administered 2020-06-27: 2000 [IU]/h via INTRAVENOUS
  Administered 2020-06-28: 1800 [IU]/h via INTRAVENOUS
  Filled 2020-06-26 (×4): qty 250

## 2020-06-26 MED ORDER — VITAMIN D 25 MCG (1000 UNIT) PO TABS
2000.0000 [IU] | ORAL_TABLET | Freq: Every day | ORAL | Status: DC
  Administered 2020-06-26: 2000 [IU] via ORAL
  Filled 2020-06-26: qty 2

## 2020-06-26 MED ORDER — METHOCARBAMOL 500 MG PO TABS
500.0000 mg | ORAL_TABLET | Freq: Three times a day (TID) | ORAL | Status: DC | PRN
  Administered 2020-06-26: 1000 mg via ORAL
  Filled 2020-06-26: qty 2

## 2020-06-26 MED ORDER — OXYCODONE HCL 5 MG PO TABS
10.0000 mg | ORAL_TABLET | Freq: Four times a day (QID) | ORAL | Status: DC | PRN
  Administered 2020-06-26: 10 mg via ORAL
  Filled 2020-06-26: qty 2

## 2020-06-26 MED ORDER — GABAPENTIN 400 MG PO CAPS
400.0000 mg | ORAL_CAPSULE | Freq: Three times a day (TID) | ORAL | Status: DC
  Administered 2020-06-26 (×2): 400 mg via ORAL
  Filled 2020-06-26 (×2): qty 1

## 2020-06-26 MED ORDER — LACTATED RINGERS IV BOLUS
1000.0000 mL | Freq: Once | INTRAVENOUS | Status: AC
  Administered 2020-06-26: 1000 mL via INTRAVENOUS

## 2020-06-26 MED ORDER — LACTATED RINGERS IV SOLN: INTRAVENOUS | Status: DC

## 2020-06-26 MED ORDER — HEPARIN BOLUS VIA INFUSION
5500.0000 [IU] | Freq: Once | INTRAVENOUS | Status: AC
  Administered 2020-06-26: 5500 [IU] via INTRAVENOUS
  Filled 2020-06-26: qty 5500
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
## 2020-06-26 NOTE — ED Notes (Signed)
Pt is at vascular and then going to MRI

## 2020-06-26 NOTE — ED Notes (Signed)
Pt transported to xray via stretcher

## 2020-06-26 NOTE — Progress Notes (Signed)
VASCULAR LAB PRELIMINARY  PRELIMINARY  PRELIMINARY  PRELIMINARY  Right upper extremity venous duplex. Preliminary report:  See CV proc for preliminary results. .   Called report to Dr. Stevie Kern.    Aava Deland, RVT 06/26/2020, 11:12 AM

## 2020-06-26 NOTE — ED Notes (Signed)
Pt returned from xray at this time.
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
## 2020-06-27 ENCOUNTER — Observation Stay (HOSPITAL_COMMUNITY): Payer: No Typology Code available for payment source

## 2020-06-27 DIAGNOSIS — I82621 Acute embolism and thrombosis of deep veins of right upper extremity: Secondary | ICD-10-CM | POA: Diagnosis not present

## 2020-06-27 DIAGNOSIS — Z7902 Long term (current) use of antithrombotics/antiplatelets: Secondary | ICD-10-CM | POA: Diagnosis not present

## 2020-06-27 DIAGNOSIS — M7989 Other specified soft tissue disorders: Secondary | ICD-10-CM

## 2020-06-27 DIAGNOSIS — R609 Edema, unspecified: Secondary | ICD-10-CM | POA: Diagnosis present

## 2020-06-27 DIAGNOSIS — N5089 Other specified disorders of the male genital organs: Secondary | ICD-10-CM | POA: Diagnosis present

## 2020-06-27 DIAGNOSIS — S32401D Unspecified fracture of right acetabulum, subsequent encounter for fracture with routine healing: Secondary | ICD-10-CM | POA: Diagnosis not present

## 2020-06-27 DIAGNOSIS — Z833 Family history of diabetes mellitus: Secondary | ICD-10-CM | POA: Diagnosis not present

## 2020-06-27 DIAGNOSIS — H538 Other visual disturbances: Secondary | ICD-10-CM | POA: Diagnosis not present

## 2020-06-27 DIAGNOSIS — Z87891 Personal history of nicotine dependence: Secondary | ICD-10-CM | POA: Diagnosis not present

## 2020-06-27 DIAGNOSIS — D62 Acute posthemorrhagic anemia: Secondary | ICD-10-CM | POA: Diagnosis not present

## 2020-06-27 DIAGNOSIS — Z79899 Other long term (current) drug therapy: Secondary | ICD-10-CM | POA: Diagnosis not present

## 2020-06-27 DIAGNOSIS — Z20822 Contact with and (suspected) exposure to covid-19: Secondary | ICD-10-CM | POA: Diagnosis present

## 2020-06-27 DIAGNOSIS — S32039D Unspecified fracture of third lumbar vertebra, subsequent encounter for fracture with routine healing: Secondary | ICD-10-CM | POA: Diagnosis not present

## 2020-06-27 DIAGNOSIS — H532 Diplopia: Secondary | ICD-10-CM | POA: Diagnosis present

## 2020-06-27 DIAGNOSIS — T783XXA Angioneurotic edema, initial encounter: Secondary | ICD-10-CM | POA: Diagnosis present

## 2020-06-27 DIAGNOSIS — I82C11 Acute embolism and thrombosis of right internal jugular vein: Secondary | ICD-10-CM | POA: Diagnosis present

## 2020-06-27 DIAGNOSIS — D649 Anemia, unspecified: Secondary | ICD-10-CM | POA: Diagnosis present

## 2020-06-27 LAB — CBC WITH DIFFERENTIAL/PLATELET
Abs Immature Granulocytes: 0.03 10*3/uL (ref 0.00–0.07)
Basophils Absolute: 0 10*3/uL (ref 0.0–0.1)
Basophils Relative: 0 %
Eosinophils Absolute: 0.1 10*3/uL (ref 0.0–0.5)
Eosinophils Relative: 1 %
HCT: 33.3 % — ABNORMAL LOW (ref 39.0–52.0)
Hemoglobin: 10.5 g/dL — ABNORMAL LOW (ref 13.0–17.0)
Immature Granulocytes: 0 %
Lymphocytes Relative: 42 %
Lymphs Abs: 3.5 10*3/uL (ref 0.7–4.0)
MCH: 29.1 pg (ref 26.0–34.0)
MCHC: 31.5 g/dL (ref 30.0–36.0)
MCV: 92.2 fL (ref 80.0–100.0)
Monocytes Absolute: 0.4 10*3/uL (ref 0.1–1.0)
Monocytes Relative: 4 %
Neutro Abs: 4.3 10*3/uL (ref 1.7–7.7)
Neutrophils Relative %: 53 %
Platelets: 300 10*3/uL (ref 150–400)
RBC: 3.61 MIL/uL — ABNORMAL LOW (ref 4.22–5.81)
RDW: 13.8 % (ref 11.5–15.5)
WBC: 8.2 10*3/uL (ref 4.0–10.5)
nRBC: 0 % (ref 0.0–0.2)

## 2020-06-27 LAB — ALBUMIN: Albumin: 3.3 g/dL — ABNORMAL LOW (ref 3.5–5.0)

## 2020-06-27 LAB — HEPARIN LEVEL (UNFRACTIONATED)
Heparin Unfractionated: 0.11 IU/mL — ABNORMAL LOW (ref 0.30–0.70)
Heparin Unfractionated: 0.8 IU/mL — ABNORMAL HIGH (ref 0.30–0.70)

## 2020-06-27 LAB — COMPREHENSIVE METABOLIC PANEL
ALT: 18 U/L (ref 0–44)
AST: 11 U/L — ABNORMAL LOW (ref 15–41)
Albumin: 3.2 g/dL — ABNORMAL LOW (ref 3.5–5.0)
Alkaline Phosphatase: 58 U/L (ref 38–126)
Anion gap: 9 (ref 5–15)
BUN: 9 mg/dL (ref 6–20)
CO2: 26 mmol/L (ref 22–32)
Calcium: 8.9 mg/dL (ref 8.9–10.3)
Chloride: 100 mmol/L (ref 98–111)
Creatinine, Ser: 0.75 mg/dL (ref 0.61–1.24)
GFR calc Af Amer: >60 mL/min (ref >60)
GFR calc non Af Amer: >60 mL/min (ref >60)
Glucose, Bld: 110 mg/dL — ABNORMAL HIGH (ref 70–99)
Potassium: 3.8 mmol/L (ref 3.5–5.1)
Sodium: 135 mmol/L (ref 135–145)
Total Bilirubin: 0.7 mg/dL (ref 0.3–1.2)
Total Protein: 6 g/dL — ABNORMAL LOW (ref 6.5–8.1)

## 2020-06-27 LAB — CBC
HCT: 33.1 % — ABNORMAL LOW (ref 39.0–52.0)
Hemoglobin: 10.5 g/dL — ABNORMAL LOW (ref 13.0–17.0)
MCH: 29 pg (ref 26.0–34.0)
MCHC: 31.7 g/dL (ref 30.0–36.0)
MCV: 91.4 fL (ref 80.0–100.0)
Platelets: 309 10*3/uL (ref 150–400)
RBC: 3.62 MIL/uL — ABNORMAL LOW (ref 4.22–5.81)
RDW: 13.6 % (ref 11.5–15.5)
WBC: 8.3 10*3/uL (ref 4.0–10.5)
nRBC: 0 % (ref 0.0–0.2)

## 2020-06-27 LAB — PROTEIN C, TOTAL: Protein C, Total: 107 % (ref 60–150)

## 2020-06-27 LAB — ANTITHROMBIN III: AntiThromb III Func: 100 % (ref 75–120)

## 2020-06-27 MED ORDER — HYDROMORPHONE HCL 1 MG/ML IJ SOLN
0.5000 mg | Freq: Four times a day (QID) | INTRAMUSCULAR | Status: DC | PRN
  Administered 2020-06-27 – 2020-06-28 (×5): 0.5 mg via INTRAVENOUS
  Filled 2020-06-27 (×5): qty 1

## 2020-06-27 MED ORDER — DIPHENHYDRAMINE HCL 50 MG/ML IJ SOLN
25.0000 mg | Freq: Once | INTRAMUSCULAR | Status: AC
  Administered 2020-06-27: 25 mg via INTRAVENOUS
  Filled 2020-06-27: qty 1

## 2020-06-27 MED ORDER — HEPARIN BOLUS VIA INFUSION
3000.0000 [IU] | Freq: Once | INTRAVENOUS | Status: AC
  Administered 2020-06-27: 3000 [IU] via INTRAVENOUS
  Filled 2020-06-27: qty 3000
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
## 2020-06-27 NOTE — Progress Notes (Signed)
ANTICOAGULATION CONSULT NOTE Pharmacy Consult for heparin Indication: DVT  No Known Allergies  Patient Measurements: Height: 5\' 11"  (180.3 cm) Weight: (!) 114.6 kg (252 lb 10.4 oz) IBW/kg (Calculated) : 75.3 Heparin Dosing Weight: 101kg  Vital Signs: Temp: 98.1 F (36.7 C) (07/30 0443) Temp Source: Oral (07/30 0443) BP: 137/74 (07/30 0443) Pulse Rate: 95 (07/30 0443)  Labs: Recent Labs    06/25/20 2042 06/26/20 2016 06/27/20 0415  HGB 11.0*  --  10.5*  HCT 35.9*  --  33.1*  PLT 352  --  309  HEPARINUNFRC  --  0.33 0.11*  CREATININE 0.76  --   --     Estimated Creatinine Clearance: 169 mL/min (by C-G formula based on SCr of 0.76 mg/dL).   Assessment: 34 y.o. male with DVT for heparin  Goal of Therapy:  Heparin level 0.3-0.7 units/ml Monitor platelets by anticoagulation protocol: Yes   Plan:  Heparin 3000 units IV bolus, then increase heparin 2000 units/hr Check heparin level in 6 hours.    32, PharmD, BCPS  06/27/2020, 6:06 AM

## 2020-06-27 NOTE — Progress Notes (Signed)
Lower extremity venous bilateral study completed.   See Cv Proc for preliminary results.   Jason Walter  
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
## 2020-06-27 NOTE — TOC Benefit Eligibility Note (Signed)
Transition of Care [facility]) Benefit Eligibility Note    Patient Details  Name: Jason Walter MRN: [id number] Date of Birth: 02-Jul-1986   Medication/Dose: 1. Eliquis / 2. Xarelto  Covered?: Yes  Tier: 2 Drug  Prescription Coverage Preferred Pharmacy: most major retail pharmacies  Spoke with Person/Company/Phone Number:: [employer]  Co-Pay: 1. 87.50 for 90 day mail order/ 30 for 30 day retail for both meds  Prior Approval: No          [name] Phone Number: 06/27/2020, 4:17 PM

## 2020-06-28 DIAGNOSIS — D689 Coagulation defect, unspecified: Secondary | ICD-10-CM

## 2020-06-28 DIAGNOSIS — T783XXA Angioneurotic edema, initial encounter: Secondary | ICD-10-CM

## 2020-06-28 DIAGNOSIS — D62 Acute posthemorrhagic anemia: Secondary | ICD-10-CM

## 2020-06-28 LAB — LUPUS ANTICOAGULANT PANEL
DRVVT: 43.4 s (ref 0.0–47.0)
PTT Lupus Anticoagulant: 43.7 s (ref 0.0–51.9)

## 2020-06-28 LAB — BASIC METABOLIC PANEL
Anion gap: 11 (ref 5–15)
BUN: 10 mg/dL (ref 6–20)
CO2: 23 mmol/L (ref 22–32)
Calcium: 9 mg/dL (ref 8.9–10.3)
Chloride: 103 mmol/L (ref 98–111)
Creatinine, Ser: 0.71 mg/dL (ref 0.61–1.24)
GFR calc Af Amer: >60 mL/min (ref >60)
GFR calc non Af Amer: >60 mL/min (ref >60)
Glucose, Bld: 92 mg/dL (ref 70–99)
Potassium: 4.5 mmol/L (ref 3.5–5.1)
Sodium: 137 mmol/L (ref 135–145)

## 2020-06-28 LAB — PROTEIN C ACTIVITY: Protein C Activity: 113 % (ref 73–180)

## 2020-06-28 LAB — CBC
HCT: 32.2 % — ABNORMAL LOW (ref 39.0–52.0)
Hemoglobin: 9.9 g/dL — ABNORMAL LOW (ref 13.0–17.0)
MCH: 28.4 pg (ref 26.0–34.0)
MCHC: 30.7 g/dL (ref 30.0–36.0)
MCV: 92.5 fL (ref 80.0–100.0)
Platelets: 273 10*3/uL (ref 150–400)
RBC: 3.48 MIL/uL — ABNORMAL LOW (ref 4.22–5.81)
RDW: 13.8 % (ref 11.5–15.5)
WBC: 6.3 10*3/uL (ref 4.0–10.5)
nRBC: 0 % (ref 0.0–0.2)

## 2020-06-28 LAB — PROTEIN S ACTIVITY: Protein S Activity: 70 % (ref 63–140)

## 2020-06-28 LAB — PROTEIN S, TOTAL: Protein S Ag, Total: 68 % (ref 60–150)

## 2020-06-28 LAB — HEPARIN LEVEL (UNFRACTIONATED): Heparin Unfractionated: 0.5 IU/mL (ref 0.30–0.70)

## 2020-06-28 LAB — HOMOCYSTEINE: Homocysteine: 10.8 umol/L (ref 0.0–14.5)

## 2020-06-28 MED ORDER — APIXABAN (ELIQUIS) VTE STARTER PACK (10MG AND 5MG): ORAL_TABLET | ORAL | 0 refills | Status: DC

## 2020-06-28 MED ORDER — APIXABAN 5 MG PO TABS: 10.0000 mg | ORAL_TABLET | Freq: Two times a day (BID) | ORAL | Status: DC

## 2020-06-28 MED ORDER — APIXABAN 5 MG PO TABS
10.0000 mg | ORAL_TABLET | Freq: Once | ORAL | Status: DC
  Filled 2020-06-28: qty 2

## 2020-06-28 MED ORDER — APIXABAN 5 MG PO TABS
10.0000 mg | ORAL_TABLET | ORAL | Status: AC
  Administered 2020-06-28: 10 mg via ORAL

## 2020-06-28 MED ORDER — DIPHENHYDRAMINE HCL 25 MG PO CAPS: 25.0000 mg | ORAL_CAPSULE | Freq: Four times a day (QID) | ORAL | Status: DC | PRN

## 2020-06-28 MED ORDER — APIXABAN 5 MG PO TABS: 5.0000 mg | ORAL_TABLET | Freq: Two times a day (BID) | ORAL | Status: DC

## 2020-06-28 MED ORDER — HYDROMORPHONE HCL 2 MG PO TABS: 1.0000 mg | ORAL_TABLET | Freq: Four times a day (QID) | ORAL | 0 refills | Status: AC | PRN

## 2020-06-28 MED ORDER — DIPHENHYDRAMINE HCL 25 MG PO CAPS: 25.0000 mg | ORAL_CAPSULE | Freq: Four times a day (QID) | ORAL | 0 refills | Status: AC | PRN

## 2020-06-28 NOTE — Progress Notes (Signed)
Mobility Specialist - Progress Note   06/28/20 1149  Mobility  Activity Ambulated in hall  Level of Assistance Modified independent, requires aide device or extra time  Assistive Device Four wheel walker  Distance Ambulated (ft) 500 ft  Mobility Response Tolerated well  Mobility performed by Mobility specialist  $Mobility charge 1 Mobility    Pre-mobility: 101 HR During mobility: 120-142 HR Post-mobility: 116 HR  Pt requested to walk with a rollator instead of his RW and said he preferred the rollator. His HR spiked to 142 at ~400 ft, it resolved with a brief sitting rest break.   Mamie Levers Mobility Specialist Mobility Specialist Phone: [phone]
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
## 2020-06-28 NOTE — Discharge Instructions (Signed)
Information on my medicine - ELIQUIS (apixaban)  Why was Eliquis prescribed for you? Eliquis was prescribed to treat blood clots that may have been found in the veins of your legs (deep vein thrombosis) or in your lungs (pulmonary embolism) and to reduce the risk of them occurring again.  What do You need to know about Eliquis ? The starting dose is 10 mg (two 5 mg tablets) taken TWICE daily for the FIRST SEVEN (7) DAYS, then on (enter date)  07/05/2020  the dose is reduced to ONE 5 mg tablet taken TWICE daily.  Eliquis may be taken with or without food.   Try to take the dose about the same time in the morning and in the evening. If you have difficulty swallowing the tablet whole please discuss with your pharmacist how to take the medication safely.  Take Eliquis exactly as prescribed and DO NOT stop taking Eliquis without talking to the doctor who prescribed the medication.  Stopping may increase your risk of developing a new blood clot.  Refill your prescription before you run out.  After discharge, you should have regular check-up appointments with your healthcare provider that is prescribing your Eliquis.    What do you do if you miss a dose? If a dose of ELIQUIS is not taken at the scheduled time, take it as soon as possible on the same day and twice-daily administration should be resumed. The dose should not be doubled to make up for a missed dose.  Important Safety Information A possible side effect of Eliquis is bleeding. You should call your healthcare provider right away if you experience any of the following: ? Bleeding from an injury or your nose that does not stop. ? Unusual colored urine (red or dark brown) or unusual colored stools (red or black). ? Unusual bruising for unknown reasons. ? A serious fall or if you hit your head (even if there is no bleeding).  Some medicines may interact with Eliquis and might increase your risk of bleeding or clotting while on  Eliquis. To help avoid this, consult your healthcare provider or pharmacist prior to using any new prescription or non-prescription medications, including herbals, vitamins, non-steroidal anti-inflammatory drugs (NSAIDs) and supplements.  This website has more information on Eliquis (apixaban): http://www.eliquis.com/eliquis/home
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
## 2020-06-29 ENCOUNTER — Other Ambulatory Visit: Payer: Self-pay

## 2020-06-29 DIAGNOSIS — Z87891 Personal history of nicotine dependence: Secondary | ICD-10-CM | POA: Insufficient documentation

## 2020-06-29 DIAGNOSIS — R220 Localized swelling, mass and lump, head: Secondary | ICD-10-CM | POA: Insufficient documentation

## 2020-06-29 LAB — C4 COMPLEMENT: Complement C4, Body Fluid: 43 mg/dL — ABNORMAL HIGH (ref 12–38)

## 2020-06-29 LAB — BETA-2-GLYCOPROTEIN I ABS, IGG/M/A
Beta-2 Glyco I IgG: <9 GPI IgG units (ref 0–20)
Beta-2-Glycoprotein I IgA: <9 GPI IgA units (ref 0–25)
Beta-2-Glycoprotein I IgM: <9 GPI IgM units (ref 0–32)

## 2020-06-29 LAB — CARDIOLIPIN ANTIBODIES, IGG, IGM, IGA
Anticardiolipin IgA: <9 APL U/mL (ref 0–11)
Anticardiolipin IgG: <9 GPL U/mL (ref 0–14)
Anticardiolipin IgM: <9 MPL U/mL (ref 0–12)

## 2020-06-30 ENCOUNTER — Emergency Department (HOSPITAL_COMMUNITY)
Admission: EM | Admit: 2020-06-30 | Discharge: 2020-06-30 | Disposition: A | Discharge status: Home / Self Care | Payer: No Typology Code available for payment source | Attending: Emergency Medicine | Admitting: Emergency Medicine

## 2020-06-30 DIAGNOSIS — T783XXA Angioneurotic edema, initial encounter: Secondary | ICD-10-CM

## 2020-06-30 LAB — C1 ESTERASE INHIBITOR: C1INH SerPl-mCnc: 29 mg/dL (ref 21–39)

## 2020-06-30 MED ORDER — PREDNISONE 20 MG PO TABS
ORAL_TABLET | ORAL | 0 refills | Status: DC
Start: 2020-06-30 — End: 2020-09-08

## 2020-06-30 MED ORDER — PREDNISONE 20 MG PO TABS
60.0000 mg | ORAL_TABLET | Freq: Once | ORAL | Status: AC
  Administered 2020-06-30: 60 mg via ORAL
  Filled 2020-06-30: qty 3

## 2020-06-30 NOTE — Discharge Instructions (Signed)
Take prednisone as prescribed   See your doctor for follow up in 1-2 days   Return to ER if you have worse lip swelling, trouble breathing, trouble swallowing.
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
## 2020-07-01 ENCOUNTER — Telehealth: Payer: Self-pay | Admitting: *Deleted

## 2020-07-01 LAB — FACTOR 5 LEIDEN

## 2020-07-01 NOTE — Telephone Encounter (Signed)
THIS IS NOT AN [facility] PT, WAS SEEN IN HOSPITAL Per [facility] insurance will not cover any of the apixaban except 2.5mg  daily. Please advise

## 2020-07-02 ENCOUNTER — Other Ambulatory Visit: Payer: Self-pay | Admitting: Student

## 2020-07-02 LAB — PROTHROMBIN GENE MUTATION

## 2020-07-02 NOTE — Telephone Encounter (Signed)
Called patient, and he has obtained his apixaban prescription for $10. He had some recurrence of angioedema since hospital discharge, which has resolved with steroids. He has a PCP appointment this afternoon. He cannot recall the name, but states it is at [address]. Instructed to call his insurance, his PCP, or [place] if he has further problems with insurance coverage.
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
## 2020-07-17 ENCOUNTER — Other Ambulatory Visit: Payer: Self-pay | Admitting: Student
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
## 2020-07-22 ENCOUNTER — Ambulatory Visit: Payer: No Typology Code available for payment source | Admitting: Allergy
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
## 2020-08-07 ENCOUNTER — Encounter: Payer: Self-pay | Admitting: Allergy

## 2020-08-07 ENCOUNTER — Other Ambulatory Visit: Payer: Self-pay

## 2020-08-07 ENCOUNTER — Ambulatory Visit (INDEPENDENT_AMBULATORY_CARE_PROVIDER_SITE_OTHER): Payer: No Typology Code available for payment source | Admitting: Allergy

## 2020-08-07 VITALS — BP 110/70 | HR 82 | Temp 97.0°F | Resp 18 | Ht 71.5 in | Wt 253.0 lb

## 2020-08-07 DIAGNOSIS — L299 Pruritus, unspecified: Secondary | ICD-10-CM

## 2020-08-07 DIAGNOSIS — T783XXD Angioneurotic edema, subsequent encounter: Secondary | ICD-10-CM

## 2020-08-07 DIAGNOSIS — T783XXA Angioneurotic edema, initial encounter: Secondary | ICD-10-CM | POA: Insufficient documentation

## 2020-08-07 MED ORDER — CETIRIZINE HCL 10 MG PO TABS
10.0000 mg | ORAL_TABLET | Freq: Every day | ORAL | 2 refills | Status: DC
Start: 2020-08-07 — End: 2020-09-08

## 2020-08-07 MED ORDER — EPINEPHRINE 0.3 MG/0.3ML IJ SOAJ: 0.3000 mg | INTRAMUSCULAR | 2 refills | Status: AC | PRN

## 2020-08-07 MED ORDER — FAMOTIDINE 20 MG PO TABS
20.0000 mg | ORAL_TABLET | Freq: Two times a day (BID) | ORAL | 2 refills | Status: DC
Start: 2020-08-07 — End: 2020-09-08

## 2020-08-07 NOTE — Patient Instructions (Addendum)
Swelling:  Not sure what's causing it. Get bloodwork:  We are ordering labs, so please allow 1-2 weeks for the results to come back. With the newly implemented Cures Act, the labs might be visible to you at the same time that they become visible to me. However, I will not address the results until all of the results are back, so please be patient.  Start zyrtec (cetirizine) 10mg  twice a day. If swelling episodes not controlled or causes drowsiness let know. Start pepcid (famotidine) 20mg  twice a day.  Avoid the following potential triggers: alcohol, tight clothing, NSAIDs, hot showers and getting overheated. Keep track of episodes and take pictures.    I have prescribed epinephrine injectable and demonstrated proper use. For mild symptoms you can take over the counter antihistamines such as Benadryl and monitor symptoms closely. If symptoms worsen or if you have severe symptoms including breathing issues, throat closure, significant swelling, whole body hives, severe diarrhea and vomiting, lightheadedness then inject epinephrine and seek immediate medical care afterwards.  Emergency action plan given.   Schedule for patch testing in the [place] office.   Patches are best placed on Monday with return to office on Wednesday and Friday of same week for readings.  Patches once placed should not get wet.  You do not have to stop any medications for patch testing but should not be on oral prednisone. You can schedule a patch testing visit when convenient for your schedule.    Follow up in 1 month or sooner if needed.
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
## 2020-08-17 DIAGNOSIS — L239 Allergic contact dermatitis, unspecified cause: Secondary | ICD-10-CM | POA: Insufficient documentation
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
## 2020-08-18 ENCOUNTER — Encounter: Payer: Self-pay | Admitting: Allergy

## 2020-08-18 ENCOUNTER — Other Ambulatory Visit: Payer: Self-pay

## 2020-08-18 ENCOUNTER — Ambulatory Visit (INDEPENDENT_AMBULATORY_CARE_PROVIDER_SITE_OTHER): Payer: No Typology Code available for payment source | Admitting: Allergy

## 2020-08-18 VITALS — BP 110/80 | HR 124 | Temp 98.0°F | Resp 16

## 2020-08-18 DIAGNOSIS — L239 Allergic contact dermatitis, unspecified cause: Secondary | ICD-10-CM | POA: Diagnosis not present

## 2020-08-18 DIAGNOSIS — T783XXD Angioneurotic edema, subsequent encounter: Secondary | ICD-10-CM
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
## 2020-08-18 NOTE — Patient Instructions (Addendum)
.   Patches placed today.  The patient was instructed regarding proper care of the patches for the next 48 hours. Do not get patches wet - avoid showering until the next visit. Do not engage in vigorous physical activity.  Patient will follow up in 48 hours and 96 hours for patch readings.   DECREASE zyrtec (cetirizine) 10mg  to once a day at night to see if it makes you less drowsy.  If swelling episodes not controlled or causes drowsiness let know.  Continue Pepcid (famotidine) 20mg  twice a day.   Avoid the following potential triggers: alcohol, tight clothing, NSAIDs, hot showers and getting overheated.  Keep track of episodes and take pictures.    For mild symptoms you can take over the counter antihistamines such as Benadryl and monitor symptoms closely. If symptoms worsen or if you have severe symptoms including breathing issues, throat closure, significant swelling, whole body hives, severe diarrhea and vomiting, lightheadedness then inject epinephrine and seek immediate medical care afterwards.  Action plan in place.   . Get bloodwork:  o We are ordering labs, so please allow 1-2 weeks for the results to come back. o With the newly implemented Cures Act, the labs might be visible to you at the same time that they become visible to me. However, I will not address the results until all of the results are back, so please be patient.  o In the meantime, continue recommendations in your patient instructions, including avoidance measures (if applicable), until you hear from me.  Follow up in 2 days for patch reading.

## 2020-08-18 NOTE — Assessment & Plan Note (Signed)
Metal patches placed today.

## 2020-08-19 NOTE — Progress Notes (Signed)
   Follow Up Note  RE: Vint Pola MRN: [id number] DOB: 03/15/1986 Date of Office Visit: 08/20/2020  Referring provider: No ref. provider found Primary care provider: Patient, No Pcp Per  History of Present Illness: I had the pleasure of seeing Jason Walter for a follow up visit at the [facility] on 08/20/2020. He is a 34 y.o. male, who is being followed for possible contact dermatitis to metals. Today he is here for initial patch test interpretation, given suspected history of contact dermatitis.   Diagnostics:  Metal patch test 48 hour reading:  Significant positive to nickel. Borderline to molybdenum chloride   Metals Patch - 08/20/20 1000    Time Antigen Placed 1532    Manufacturer Other   [employer]   Location Back    Number of Test 11    Lot # [id number]    Reading Interval Day 1;Day 3;Day 5    Select Select    Aluminum Hydroxide 10% 0    Chromium chloride 1% 0    Cobalt chloride hexahydrate 1% 0    Molybdenum chloride 0.5% --   +/-   Nickel sulfate hexahydrate 5% 3    Potassium dichromate 0.25% 0    Copper sulfate pentahydrate 2% 0    Tantal 1% 0    Titanium 0.1% 0    Manganese chloride 0.5% 0    Vanadium Pentoxide 10% 0            Assessment and Plan: Swain is a 34 y.o. male with: Allergic contact dermatitis 48 hour reading for metal patch test showed significant positive to nickel and borderline to molybdenum chloride.   The patient has been provided detailed information regarding the substances he is sensitive to, as well as products containing the substances.  Meticulous avoidance of these substances is recommended.   Return in about 2 days (around 08/22/2020) for Patch reading.  It was my pleasure to see Aitan today and participate in his care. Please feel free to contact me with any questions or concerns.  Sincerely,  Wyline Mood, DO Allergy & Immunology  [facility] office:  [phone] [facility] office: [phone] [place] office: [phone]

## 2020-08-20 ENCOUNTER — Encounter: Payer: Self-pay | Admitting: Allergy

## 2020-08-20 ENCOUNTER — Other Ambulatory Visit: Payer: Self-pay

## 2020-08-20 ENCOUNTER — Ambulatory Visit: Payer: No Typology Code available for payment source | Admitting: Allergy

## 2020-08-20 DIAGNOSIS — L239 Allergic contact dermatitis, unspecified cause: Secondary | ICD-10-CM

## 2020-08-20 NOTE — Assessment & Plan Note (Signed)
48 hour reading for metal patch test showed significant positive to nickel and borderline to molybdenum chloride.

## 2020-08-21 NOTE — Progress Notes (Signed)
° ° °  Follow-up Note  RE: Jason Walter MRN: [id number] DOB: Jan 16, 1986 Date of Office Visit: 08/22/2020  Primary care provider: Patient, No Pcp Per Referring provider: No ref. provider found   Serge returns to the office today for the final patch test interpretation, given suspected history of contact dermatitis.    Diagnostics:   Metals testing 96-hour hour reading:  Positive results include: nickel sulfate hexahydrate 5% Negative results include: chromium chloride 1%, potassium dichromate 0.25%, cobalt chloride hexahydrate 1%, copper sulfate pentahydrate 2%, molybdenum chloride 0.5%, titanium 0.1%, tantal, magnese chloride 0.5%, aluminum hydroxide 10%, and vanadium pentoxide 10%.  Plan:   Allergic contact dermatitis - The patient has been provided detailed information regarding the substances she is sensitive to, as well as products containing the substances.   - Meticulous avoidance of these substances is recommended.  - If avoidance is not possible, the use of barrier creams or lotions is recommended. - If symptoms persist or progress despite meticulous avoidance of molybdenum chloride 0.5% and nickel sulfate hexahydrate 5%, a dermatology referral may be warranted. - Begin hydrocortisone cream to red itchy areas twice a day as needed Thank you for the opportunity to care for this patient.  Please do not hesitate to contact me with questions.  Thermon Leyland, FNP [facility]

## 2020-08-22 ENCOUNTER — Encounter: Payer: Self-pay | Admitting: Family Medicine

## 2020-08-22 ENCOUNTER — Other Ambulatory Visit: Payer: Self-pay

## 2020-08-22 ENCOUNTER — Ambulatory Visit (INDEPENDENT_AMBULATORY_CARE_PROVIDER_SITE_OTHER): Payer: No Typology Code available for payment source | Admitting: Family Medicine

## 2020-08-22 DIAGNOSIS — L239 Allergic contact dermatitis, unspecified cause: Secondary | ICD-10-CM | POA: Diagnosis not present

## 2020-08-22 NOTE — Patient Instructions (Signed)
Allergic contact dermatitis - The patient has been provided detailed information regarding the substances she is sensitive to, as well as products containing the substances.   - Meticulous avoidance of these substances is recommended.  - If avoidance is not possible, the use of barrier creams or lotions is recommended. - If symptoms persist or progress despite meticulous avoidance of molybdenum chloride 0.5% and nickel sulfate hexahydrate 5%, a dermatology referral may be warranted. - Begin hydrocortisone cream to red itchy areas twice a day as needed  Call the clinic if this treatment plan is not working well for you  Follow up as needed

## 2020-08-27 LAB — COMPREHENSIVE METABOLIC PANEL WITH GFR
ALT: 37 IU/L (ref 0–44)
AST: 19 IU/L (ref 0–40)
Albumin/Globulin Ratio: 1.5 (ref 1.2–2.2)
Albumin: 4.7 g/dL (ref 4.0–5.0)
Alkaline Phosphatase: 115 IU/L (ref 44–121)
BUN/Creatinine Ratio: 12 (ref 9–20)
BUN: 11 mg/dL (ref 6–20)
Bilirubin Total: <0.2 mg/dL (ref 0.0–1.2)
CO2: 21 mmol/L (ref 20–29)
Calcium: 9.4 mg/dL (ref 8.7–10.2)
Chloride: 100 mmol/L (ref 96–106)
Creatinine, Ser: 0.91 mg/dL (ref 0.76–1.27)
GFR calc Af Amer: 128 mL/min/1.73 (ref >59)
GFR calc non Af Amer: 110 mL/min/1.73 (ref >59)
Globulin, Total: 3.2 g/dL (ref 1.5–4.5)
Glucose: 147 mg/dL — ABNORMAL HIGH (ref 65–99)
Potassium: 4.2 mmol/L (ref 3.5–5.2)
Sodium: 139 mmol/L (ref 134–144)
Total Protein: 7.9 g/dL (ref 6.0–8.5)

## 2020-08-27 LAB — ALPHA-GAL PANEL
Alpha Gal IgE*: <0.1 kU/L (ref <0.10)
Beef (Bos spp) IgE: <0.1 kU/L (ref <0.35)
Class Interpretation: 0
Class Interpretation: 0
Class Interpretation: 0
Lamb/Mutton (Ovis spp) IgE: <0.1 kU/L (ref <0.35)
Pork (Sus spp) IgE: <0.1 kU/L (ref <0.35)

## 2020-08-27 LAB — C1 ESTERASE INHIBITOR: C1INH SerPl-mCnc: 31 mg/dL (ref 21–39)

## 2020-08-27 LAB — CHRONIC URTICARIA: cu index: 8.7 (ref <10)

## 2020-08-27 LAB — ANA W/REFLEX: Anti Nuclear Antibody (ANA): NEGATIVE

## 2020-08-27 LAB — THYROID CASCADE PROFILE: TSH: 0.791 u[IU]/mL (ref 0.450–4.500)

## 2020-08-27 LAB — C1 ESTERASE INHIBITOR, FUNCTIONAL: C1INH Functional/C1INH Total MFr SerPl: >91 %{normal}

## 2020-08-27 LAB — C3 AND C4
Complement C3, Serum: 150 mg/dL (ref 82–167)
Complement C4, Serum: 35 mg/dL (ref 12–38)

## 2020-08-27 LAB — SEDIMENTATION RATE: Sed Rate: 30 mm/hr — ABNORMAL HIGH (ref 0–15)

## 2020-08-27 LAB — C-REACTIVE PROTEIN: CRP: 1 mg/L (ref 0–10)

## 2020-08-27 LAB — COMPLEMENT COMPONENT C1Q: Complement C1Q: 11.4 mg/dL (ref 10.2–20.3)

## 2020-08-27 LAB — TRYPTASE: Tryptase: 3.8 ug/L (ref 2.2–13.2)

## 2020-09-01 ENCOUNTER — Telehealth: Payer: Self-pay

## 2020-09-01 NOTE — Telephone Encounter (Signed)
-----   Message from Ellamae Sia, DO sent at 08/27/2020  2:57 PM EDT ----- Please call patient.  I reviewed the bloodwork. Blood count, kidney function, liver function, electrolytes, thyroid, autoimmune screener, chronic urticaria index (checks for autoantibodies that trigger mast cells), tryptase (checks for mast cell issues), angioedema (swelling) panel, and alpha gal (checks for red meat allergy) were all normal which is great.   One of the inflammation markers was slightly elevated which may have been due to your recent swelling episode and steroid course.   Based on the above bloodwork results there is no trigger for the swelling/itching.  Your patch testing did come back positive to Nickel.   Keep October 11th appointment to discuss further.

## 2020-09-01 NOTE — Telephone Encounter (Signed)
Pt is aware.  

## 2020-09-08 ENCOUNTER — Ambulatory Visit (INDEPENDENT_AMBULATORY_CARE_PROVIDER_SITE_OTHER): Payer: No Typology Code available for payment source | Admitting: Allergy

## 2020-09-08 ENCOUNTER — Other Ambulatory Visit: Payer: Self-pay

## 2020-09-08 ENCOUNTER — Encounter: Payer: Self-pay | Admitting: Allergy

## 2020-09-08 VITALS — BP 112/68 | HR 87 | Temp 98.4°F | Resp 16

## 2020-09-08 DIAGNOSIS — L230 Allergic contact dermatitis due to metals: Secondary | ICD-10-CM | POA: Diagnosis not present

## 2020-09-08 DIAGNOSIS — T783XXD Angioneurotic edema, subsequent encounter: Secondary | ICD-10-CM

## 2020-09-08 MED ORDER — CETIRIZINE HCL 10 MG PO TABS: 10.0000 mg | ORAL_TABLET | Freq: Every day | ORAL | 5 refills | Status: AC

## 2020-09-08 MED ORDER — FAMOTIDINE 20 MG PO TABS: 20.0000 mg | ORAL_TABLET | Freq: Two times a day (BID) | ORAL | 5 refills | Status: AC

## 2020-09-08 NOTE — Patient Instructions (Addendum)
Allergic contact dermatitis  Continue to avoid molybdenum chloride 0.5% and nickel sulfate hexahydrate 5%.  Angioedema  Continue zyrtec (cetirizine) 10mg  twice a day.   Continue Pepcid (famotidine) 20mg  twice a day.   Avoid the following potential triggers: alcohol, tight clothing, NSAIDs, hot showers and getting overheated.  Keep track of episodes and take pictures.    For mild symptoms you can take over the counter antihistamines such as Benadryl and monitor symptoms closely. If symptoms worsen or if you have severe symptoms including breathing issues, throat closure, significant swelling, whole body hives, severe diarrhea and vomiting, lightheadedness then inject epinephrine and seek immediate medical care afterwards.  Action plan in place.   Follow up with your PCP regarding the blood clot, mood and forgetfulness. Follow up with your surgeon regarding the hip.  Follow up with me in 4 months or sooner if needed.
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
## 2020-09-08 NOTE — Assessment & Plan Note (Addendum)
Metal patch testing positive to below.   Continue to avoid molybdenum chloride 0.5% and nickel sulfate hexahydrate 5%.  Discussed with patient and surgeon that as long as there is no infection/inflammation around the hardware and patient is doing well not necessary to remove the hardware.
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
## 2020-10-08 ENCOUNTER — Other Ambulatory Visit: Payer: Self-pay

## 2020-10-08 DIAGNOSIS — I82C29 Chronic embolism and thrombosis of unspecified internal jugular vein: Secondary | ICD-10-CM

## 2020-10-17 ENCOUNTER — Other Ambulatory Visit: Payer: Self-pay | Admitting: Student

## 2020-10-17 DIAGNOSIS — S32401D Unspecified fracture of right acetabulum, subsequent encounter for fracture with routine healing: Secondary | ICD-10-CM

## 2020-10-21 ENCOUNTER — Ambulatory Visit (INDEPENDENT_AMBULATORY_CARE_PROVIDER_SITE_OTHER): Payer: No Typology Code available for payment source | Admitting: Vascular Surgery

## 2020-10-21 ENCOUNTER — Other Ambulatory Visit: Payer: Self-pay

## 2020-10-21 ENCOUNTER — Encounter: Payer: Self-pay | Admitting: Vascular Surgery

## 2020-10-21 ENCOUNTER — Ambulatory Visit (HOSPITAL_COMMUNITY)
Admission: RE | Admit: 2020-10-21 | Discharge: 2020-10-21 | Disposition: A | Discharge status: Home / Self Care | Payer: No Typology Code available for payment source | Source: Ambulatory Visit | Attending: Vascular Surgery | Admitting: Vascular Surgery

## 2020-10-21 VITALS — BP 130/71 | HR 62 | Temp 98.1°F | Resp 20 | Ht 71.5 in | Wt 253.0 lb

## 2020-10-21 DIAGNOSIS — Z86718 Personal history of other venous thrombosis and embolism: Secondary | ICD-10-CM

## 2020-10-21 DIAGNOSIS — I82C29 Chronic embolism and thrombosis of unspecified internal jugular vein: Secondary | ICD-10-CM | POA: Diagnosis not present
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
## 2020-11-06 ENCOUNTER — Ambulatory Visit
Admission: RE | Admit: 2020-11-06 | Discharge: 2020-11-06 | Disposition: A | Discharge status: Home / Self Care | Payer: No Typology Code available for payment source | Source: Ambulatory Visit | Attending: Student | Admitting: Student

## 2020-11-06 DIAGNOSIS — S32401D Unspecified fracture of right acetabulum, subsequent encounter for fracture with routine healing: Secondary | ICD-10-CM

## 2020-12-08 ENCOUNTER — Other Ambulatory Visit (HOSPITAL_COMMUNITY)
Admission: RE | Admit: 2020-12-08 | Discharge: 2020-12-08 | Disposition: A | Discharge status: Home / Self Care | Payer: No Typology Code available for payment source | Source: Ambulatory Visit | Attending: Student | Admitting: Student

## 2020-12-08 DIAGNOSIS — Z01812 Encounter for preprocedural laboratory examination: Secondary | ICD-10-CM | POA: Diagnosis present

## 2020-12-08 DIAGNOSIS — Z20822 Contact with and (suspected) exposure to covid-19: Secondary | ICD-10-CM | POA: Insufficient documentation

## 2020-12-08 LAB — SARS CORONAVIRUS 2 (TAT 6-24 HRS): SARS Coronavirus 2: NEGATIVE

## 2020-12-09 ENCOUNTER — Encounter (HOSPITAL_COMMUNITY): Payer: Self-pay | Admitting: Student

## 2020-12-09 ENCOUNTER — Other Ambulatory Visit: Payer: Self-pay
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
## 2020-12-09 NOTE — Progress Notes (Signed)
Jason Walter denies chest pain or shortness of breath. Patient tested negative for Covid on 12/08/20 and has been in quarantine since that time.

## 2020-12-10 ENCOUNTER — Ambulatory Visit (HOSPITAL_COMMUNITY)
Admission: RE | Admit: 2020-12-10 | Discharge: 2020-12-10 | Disposition: A | Discharge status: Home / Self Care | Payer: No Typology Code available for payment source | Attending: Student | Admitting: Student

## 2020-12-10 ENCOUNTER — Ambulatory Visit (HOSPITAL_COMMUNITY): Payer: No Typology Code available for payment source | Admitting: Certified Registered Nurse Anesthetist

## 2020-12-10 ENCOUNTER — Encounter (HOSPITAL_COMMUNITY): Payer: Self-pay | Admitting: Student

## 2020-12-10 ENCOUNTER — Encounter (HOSPITAL_COMMUNITY)
Admission: RE | Disposition: A | Discharge status: Home / Self Care | Payer: Self-pay | Source: Home / Self Care | Attending: Student

## 2020-12-10 ENCOUNTER — Ambulatory Visit (HOSPITAL_COMMUNITY): Payer: No Typology Code available for payment source

## 2020-12-10 DIAGNOSIS — T783XXA Angioneurotic edema, initial encounter: Secondary | ICD-10-CM | POA: Insufficient documentation

## 2020-12-10 DIAGNOSIS — Y831 Surgical operation with implant of artificial internal device as the cause of abnormal reaction of the patient, or of later complication, without mention of misadventure at the time of the procedure: Secondary | ICD-10-CM | POA: Insufficient documentation

## 2020-12-10 DIAGNOSIS — Z87891 Personal history of nicotine dependence: Secondary | ICD-10-CM | POA: Insufficient documentation

## 2020-12-10 DIAGNOSIS — T8489XA Other specified complication of internal orthopedic prosthetic devices, implants and grafts, initial encounter: Secondary | ICD-10-CM | POA: Diagnosis not present

## 2020-12-10 DIAGNOSIS — T7849XA Other allergy, initial encounter: Secondary | ICD-10-CM | POA: Diagnosis present

## 2020-12-10 DIAGNOSIS — Z7952 Long term (current) use of systemic steroids: Secondary | ICD-10-CM | POA: Insufficient documentation

## 2020-12-10 DIAGNOSIS — T1490XA Injury, unspecified, initial encounter: Secondary | ICD-10-CM

## 2020-12-10 DIAGNOSIS — Z79899 Other long term (current) drug therapy: Secondary | ICD-10-CM | POA: Diagnosis not present

## 2020-12-10 DIAGNOSIS — Z86718 Personal history of other venous thrombosis and embolism: Secondary | ICD-10-CM | POA: Diagnosis not present

## 2020-12-10 HISTORY — DX: Post-traumatic stress disorder, unspecified: F43.10

## 2020-12-10 HISTORY — DX: Gastro-esophageal reflux disease without esophagitis: K21.9

## 2020-12-10 HISTORY — PX: HARDWARE REMOVAL: SHX979

## 2020-12-10 LAB — CBC
HCT: 41.5 % (ref 39.0–52.0)
Hemoglobin: 13.2 g/dL (ref 13.0–17.0)
MCH: 28.4 pg (ref 26.0–34.0)
MCHC: 31.8 g/dL (ref 30.0–36.0)
MCV: 89.2 fL (ref 80.0–100.0)
Platelets: 277 10*3/uL (ref 150–400)
RBC: 4.65 MIL/uL (ref 4.22–5.81)
RDW: 14.8 % (ref 11.5–15.5)
WBC: 5.6 10*3/uL (ref 4.0–10.5)
nRBC: 0 % (ref 0.0–0.2)

## 2020-12-10 SURGERY — REMOVAL, HARDWARE
Anesthesia: General | Site: Hip | Laterality: Right

## 2020-12-10 MED ORDER — KETAMINE HCL 50 MG/5ML IJ SOSY
PREFILLED_SYRINGE | INTRAMUSCULAR | Status: AC
  Filled 2020-12-10: qty 5

## 2020-12-10 MED ORDER — ASPIRIN EC 81 MG PO TBEC: 81.0000 mg | DELAYED_RELEASE_TABLET | Freq: Every day | ORAL | 0 refills | Status: AC

## 2020-12-10 MED ORDER — ALBUMIN HUMAN 5 % IV SOLN: INTRAVENOUS | Status: DC | PRN

## 2020-12-10 MED ORDER — 0.9 % SODIUM CHLORIDE (POUR BTL) OPTIME
TOPICAL | Status: DC | PRN
  Administered 2020-12-10: 1000 mL

## 2020-12-10 MED ORDER — TRANEXAMIC ACID-NACL 1000-0.7 MG/100ML-% IV SOLN
INTRAVENOUS | Status: AC
  Filled 2020-12-10: qty 100

## 2020-12-10 MED ORDER — HYDROMORPHONE HCL 1 MG/ML IJ SOLN
INTRAMUSCULAR | Status: DC | PRN
  Administered 2020-12-10: .5 mg via INTRAVENOUS

## 2020-12-10 MED ORDER — ROCURONIUM BROMIDE 10 MG/ML (PF) SYRINGE
PREFILLED_SYRINGE | INTRAVENOUS | Status: DC | PRN
  Administered 2020-12-10: 20 mg via INTRAVENOUS
  Administered 2020-12-10: 100 mg via INTRAVENOUS

## 2020-12-10 MED ORDER — KETOROLAC TROMETHAMINE 30 MG/ML IJ SOLN
INTRAMUSCULAR | Status: AC
  Filled 2020-12-10: qty 1

## 2020-12-10 MED ORDER — ROCURONIUM BROMIDE 10 MG/ML (PF) SYRINGE
PREFILLED_SYRINGE | INTRAVENOUS | Status: AC
  Filled 2020-12-10: qty 10

## 2020-12-10 MED ORDER — ACETAMINOPHEN 10 MG/ML IV SOLN
INTRAVENOUS | Status: AC
  Filled 2020-12-10: qty 100

## 2020-12-10 MED ORDER — LIDOCAINE 2% (20 MG/ML) 5 ML SYRINGE
INTRAMUSCULAR | Status: DC | PRN
  Administered 2020-12-10: 60 mg via INTRAVENOUS

## 2020-12-10 MED ORDER — HYDROMORPHONE HCL 1 MG/ML IJ SOLN
INTRAMUSCULAR | Status: AC
  Filled 2020-12-10: qty 1

## 2020-12-10 MED ORDER — FENTANYL CITRATE (PF) 100 MCG/2ML IJ SOLN
INTRAMUSCULAR | Status: DC | PRN
  Administered 2020-12-10: 100 ug via INTRAVENOUS

## 2020-12-10 MED ORDER — VANCOMYCIN HCL 1000 MG IV SOLR
INTRAVENOUS | Status: AC
  Filled 2020-12-10: qty 1000

## 2020-12-10 MED ORDER — GLYCOPYRROLATE PF 0.2 MG/ML IJ SOSY
PREFILLED_SYRINGE | INTRAMUSCULAR | Status: DC | PRN
  Administered 2020-12-10: .1 mg via INTRAVENOUS

## 2020-12-10 MED ORDER — VANCOMYCIN HCL 1000 MG IV SOLR
INTRAVENOUS | Status: DC | PRN
  Administered 2020-12-10: 1000 mg

## 2020-12-10 MED ORDER — DEXAMETHASONE SODIUM PHOSPHATE 10 MG/ML IJ SOLN
INTRAMUSCULAR | Status: AC
  Filled 2020-12-10: qty 1

## 2020-12-10 MED ORDER — SUGAMMADEX SODIUM 200 MG/2ML IV SOLN
INTRAVENOUS | Status: DC | PRN
  Administered 2020-12-10: 300 mg via INTRAVENOUS

## 2020-12-10 MED ORDER — MIDAZOLAM HCL 2 MG/2ML IJ SOLN
INTRAMUSCULAR | Status: AC
  Filled 2020-12-10: qty 2

## 2020-12-10 MED ORDER — FENTANYL CITRATE (PF) 250 MCG/5ML IJ SOLN
INTRAMUSCULAR | Status: AC
  Filled 2020-12-10: qty 5

## 2020-12-10 MED ORDER — GLYCOPYRROLATE PF 0.2 MG/ML IJ SOSY
PREFILLED_SYRINGE | INTRAMUSCULAR | Status: AC
  Filled 2020-12-10: qty 1

## 2020-12-10 MED ORDER — OXYCODONE HCL 5 MG/5ML PO SOLN: 5.0000 mg | Freq: Once | ORAL | Status: AC | PRN

## 2020-12-10 MED ORDER — DEXMEDETOMIDINE (PRECEDEX) IN NS 20 MCG/5ML (4 MCG/ML) IV SYRINGE
PREFILLED_SYRINGE | INTRAVENOUS | Status: DC | PRN
  Administered 2020-12-10: 4 ug via INTRAVENOUS
  Administered 2020-12-10: 8 ug via INTRAVENOUS
  Administered 2020-12-10: 12 ug via INTRAVENOUS
  Administered 2020-12-10: 8 ug via INTRAVENOUS

## 2020-12-10 MED ORDER — PROPOFOL 10 MG/ML IV BOLUS
INTRAVENOUS | Status: AC
  Filled 2020-12-10: qty 40

## 2020-12-10 MED ORDER — HYDROMORPHONE HCL 1 MG/ML IJ SOLN
0.2500 mg | INTRAMUSCULAR | Status: DC | PRN
  Administered 2020-12-10 (×4): 0.5 mg via INTRAVENOUS

## 2020-12-10 MED ORDER — CHLORHEXIDINE GLUCONATE 0.12 % MT SOLN
15.0000 mL | Freq: Once | OROMUCOSAL | Status: AC
  Administered 2020-12-10: 15 mL via OROMUCOSAL
  Filled 2020-12-10: qty 15

## 2020-12-10 MED ORDER — ORAL CARE MOUTH RINSE: 15.0000 mL | Freq: Once | OROMUCOSAL | Status: AC

## 2020-12-10 MED ORDER — LIDOCAINE 2% (20 MG/ML) 5 ML SYRINGE
INTRAMUSCULAR | Status: AC
  Filled 2020-12-10: qty 5

## 2020-12-10 MED ORDER — KETAMINE HCL 10 MG/ML IJ SOLN
INTRAMUSCULAR | Status: DC | PRN
  Administered 2020-12-10: 30 mg via INTRAVENOUS
  Administered 2020-12-10: 20 mg via INTRAVENOUS

## 2020-12-10 MED ORDER — DEXMEDETOMIDINE (PRECEDEX) IN NS 20 MCG/5ML (4 MCG/ML) IV SYRINGE
PREFILLED_SYRINGE | INTRAVENOUS | Status: AC
  Filled 2020-12-10: qty 5

## 2020-12-10 MED ORDER — ONDANSETRON HCL 4 MG/2ML IJ SOLN
INTRAMUSCULAR | Status: DC | PRN
  Administered 2020-12-10: 4 mg via INTRAVENOUS

## 2020-12-10 MED ORDER — MIDAZOLAM HCL 5 MG/5ML IJ SOLN
INTRAMUSCULAR | Status: DC | PRN
  Administered 2020-12-10 (×2): 1 mg via INTRAVENOUS

## 2020-12-10 MED ORDER — OXYCODONE HCL 5 MG PO TABS
5.0000 mg | ORAL_TABLET | Freq: Once | ORAL | Status: AC | PRN
  Administered 2020-12-10: 5 mg via ORAL

## 2020-12-10 MED ORDER — KETOROLAC TROMETHAMINE 30 MG/ML IJ SOLN
30.0000 mg | Freq: Once | INTRAMUSCULAR | Status: AC | PRN
  Administered 2020-12-10: 30 mg via INTRAVENOUS

## 2020-12-10 MED ORDER — LACTATED RINGERS IV SOLN: INTRAVENOUS | Status: DC

## 2020-12-10 MED ORDER — DEXTROSE 5 % IV SOLN
3.0000 g | INTRAVENOUS | Status: AC
  Administered 2020-12-10: 3 g via INTRAVENOUS
  Filled 2020-12-10: qty 3000

## 2020-12-10 MED ORDER — OXYCODONE HCL 5 MG PO TABS: 5.0000 mg | ORAL_TABLET | Freq: Four times a day (QID) | ORAL | 0 refills | Status: DC | PRN

## 2020-12-10 MED ORDER — HYDROMORPHONE HCL 1 MG/ML IJ SOLN
INTRAMUSCULAR | Status: AC
  Filled 2020-12-10: qty 0.5

## 2020-12-10 MED ORDER — ONDANSETRON HCL 4 MG/2ML IJ SOLN
INTRAMUSCULAR | Status: AC
  Filled 2020-12-10: qty 2

## 2020-12-10 MED ORDER — DEXAMETHASONE SODIUM PHOSPHATE 10 MG/ML IJ SOLN
INTRAMUSCULAR | Status: DC | PRN
  Administered 2020-12-10: 10 mg via INTRAVENOUS

## 2020-12-10 MED ORDER — OXYCODONE HCL 5 MG PO TABS
ORAL_TABLET | ORAL | Status: AC
  Filled 2020-12-10: qty 1

## 2020-12-10 MED ORDER — PROMETHAZINE HCL 25 MG/ML IJ SOLN: 6.2500 mg | INTRAMUSCULAR | Status: DC | PRN

## 2020-12-10 MED ORDER — PROPOFOL 10 MG/ML IV BOLUS
INTRAVENOUS | Status: DC | PRN
  Administered 2020-12-10: 200 mg via INTRAVENOUS
  Administered 2020-12-10: 30 mg via INTRAVENOUS

## 2020-12-10 MED ORDER — MEPERIDINE HCL 25 MG/ML IJ SOLN: 6.2500 mg | INTRAMUSCULAR | Status: DC | PRN

## 2020-12-10 MED ORDER — ACETAMINOPHEN 10 MG/ML IV SOLN
INTRAVENOUS | Status: DC | PRN
  Administered 2020-12-10: 1000 mg via INTRAVENOUS

## 2020-12-10 SURGICAL SUPPLY — 66 items
BANDAGE ESMARK 6X9 LF (GAUZE/BANDAGES/DRESSINGS) IMPLANT
BNDG COHESIVE 6X5 TAN STRL LF (GAUZE/BANDAGES/DRESSINGS) ×2 IMPLANT
BNDG ELASTIC 4X5.8 VLCR STR LF (GAUZE/BANDAGES/DRESSINGS) IMPLANT
BNDG ELASTIC 6X5.8 VLCR STR LF (GAUZE/BANDAGES/DRESSINGS) IMPLANT
BNDG ESMARK 6X9 LF (GAUZE/BANDAGES/DRESSINGS)
BNDG GAUZE ELAST 4 BULKY (GAUZE/BANDAGES/DRESSINGS) ×2 IMPLANT
BRUSH SCRUB EZ PLAIN DRY (MISCELLANEOUS) ×2 IMPLANT
CHLORAPREP W/TINT 26 (MISCELLANEOUS) ×4 IMPLANT
COVER SURGICAL LIGHT HANDLE (MISCELLANEOUS) ×2 IMPLANT
COVER WAND RF STERILE (DRAPES) IMPLANT
CUFF TOURN SGL QUICK 18X4 (TOURNIQUET CUFF) IMPLANT
CUFF TOURN SGL QUICK 24 (TOURNIQUET CUFF)
CUFF TOURN SGL QUICK 34 (TOURNIQUET CUFF)
CUFF TRNQT CYL 24X4X16.5-23 (TOURNIQUET CUFF) IMPLANT
CUFF TRNQT CYL 34X4.125X (TOURNIQUET CUFF) IMPLANT
DRAPE C-ARM 42X72 X-RAY (DRAPES) IMPLANT
DRAPE C-ARMOR (DRAPES) ×2 IMPLANT
DRAPE ORTHO SPLIT 77X108 STRL (DRAPES) ×2
DRAPE SURG ORHT 6 SPLT 77X108 (DRAPES) ×2 IMPLANT
DRAPE U-SHAPE 47X51 STRL (DRAPES) ×4 IMPLANT
DRSG ADAPTIC 3X8 NADH LF (GAUZE/BANDAGES/DRESSINGS) IMPLANT
DRSG MEPILEX BORDER 4X12 (GAUZE/BANDAGES/DRESSINGS) ×2 IMPLANT
DRSG MEPILEX BORDER 4X4 (GAUZE/BANDAGES/DRESSINGS) ×2 IMPLANT
ELECT REM PT RETURN 9FT ADLT (ELECTROSURGICAL) ×2
ELECTRODE REM PT RTRN 9FT ADLT (ELECTROSURGICAL) ×1 IMPLANT
GAUZE SPONGE 4X4 12PLY STRL (GAUZE/BANDAGES/DRESSINGS) IMPLANT
GLOVE BIO SURGEON STRL SZ 6.5 (GLOVE) ×6 IMPLANT
GLOVE BIO SURGEON STRL SZ7.5 (GLOVE) ×8 IMPLANT
GLOVE BIOGEL PI IND STRL 6.5 (GLOVE) ×1 IMPLANT
GLOVE BIOGEL PI IND STRL 7.5 (GLOVE) ×1 IMPLANT
GLOVE BIOGEL PI INDICATOR 6.5 (GLOVE) ×1
GLOVE BIOGEL PI INDICATOR 7.5 (GLOVE) ×1
GOWN STRL REUS W/ TWL LRG LVL3 (GOWN DISPOSABLE) ×3 IMPLANT
GOWN STRL REUS W/TWL LRG LVL3 (GOWN DISPOSABLE) ×3
HYDROGEN PEROXIDE 16OZ (MISCELLANEOUS) ×2 IMPLANT
KIT BASIN OR (CUSTOM PROCEDURE TRAY) ×2 IMPLANT
KIT TURNOVER KIT B (KITS) ×2 IMPLANT
MANIFOLD NEPTUNE II (INSTRUMENTS) ×2 IMPLANT
NEEDLE 22X1 1/2 (OR ONLY) (NEEDLE) IMPLANT
NS IRRIG 1000ML POUR BTL (IV SOLUTION) ×2 IMPLANT
PACK ORTHO EXTREMITY (CUSTOM PROCEDURE TRAY) ×2 IMPLANT
PAD ARMBOARD 7.5X6 YLW CONV (MISCELLANEOUS) ×4 IMPLANT
PADDING CAST COTTON 6X4 STRL (CAST SUPPLIES) IMPLANT
SPONGE LAP 18X18 RF (DISPOSABLE) ×2 IMPLANT
STAPLER VISISTAT 35W (STAPLE) IMPLANT
STOCKINETTE IMPERVIOUS LG (DRAPES) ×2 IMPLANT
STRIP CLOSURE SKIN 1/2X4 (GAUZE/BANDAGES/DRESSINGS) IMPLANT
SUCTION FRAZIER HANDLE 10FR (MISCELLANEOUS) ×1
SUCTION TUBE FRAZIER 10FR DISP (MISCELLANEOUS) ×1 IMPLANT
SUT ETHILON 3 0 PS 1 (SUTURE) IMPLANT
SUT MNCRL AB 3-0 PS2 18 (SUTURE) IMPLANT
SUT MON AB 2-0 CT1 36 (SUTURE) ×4 IMPLANT
SUT PDS AB 2-0 CT1 27 (SUTURE) IMPLANT
SUT VIC AB 0 CT1 27 (SUTURE) ×2
SUT VIC AB 0 CT1 27XBRD ANBCTR (SUTURE) ×2 IMPLANT
SUT VIC AB 1 CT1 27 (SUTURE) ×2
SUT VIC AB 1 CT1 27XBRD ANBCTR (SUTURE) ×2 IMPLANT
SUT VIC AB 2-0 CT1 27 (SUTURE) ×2
SUT VIC AB 2-0 CT1 TAPERPNT 27 (SUTURE) ×2 IMPLANT
SYR CONTROL 10ML LL (SYRINGE) IMPLANT
TOWEL GREEN STERILE (TOWEL DISPOSABLE) ×4 IMPLANT
TOWEL GREEN STERILE FF (TOWEL DISPOSABLE) ×4 IMPLANT
TUBE CONNECTING 12X1/4 (SUCTIONS) ×2 IMPLANT
UNDERPAD 30X36 HEAVY ABSORB (UNDERPADS AND DIAPERS) ×2 IMPLANT
WATER STERILE IRR 1000ML POUR (IV SOLUTION) IMPLANT
YANKAUER SUCT BULB TIP NO VENT (SUCTIONS) ×2 IMPLANT
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
## 2020-12-10 NOTE — Discharge Instructions (Addendum)
[facility] Discharge Instructions   General Discharge Instructions  WEIGHT BEARING STATUS: Weightbearing as tolerated right lower extremity  RANGE OF MOTION/ACTIVITY: Ok for gentle hip and knee motion as tolerated  Wound Care: May remove surgical dressing on post-op day #3 (Saturday 12/14/19) Incisions can be left open to air if there is no drainage. If incision continues to have drainage, follow wound care instructions below. Okay to shower if no drainage from incisions.  DVT/PE prophylaxis: Aspirin 81 mg  Diet: as you were eating previously.  Can use over the counter stool softeners and bowel preparations, such as Miralax, to help with bowel movements.  Narcotics can be constipating.  Be sure to drink plenty of fluids  PAIN MEDICATION USE AND EXPECTATIONS  You have likely been given narcotic medications to help control your pain.  After a traumatic event that results in an fracture (broken bone) with or without surgery, it is ok to use narcotic pain medications to help control one's pain.  We understand that everyone responds to pain differently and each individual patient will be evaluated on a regular basis for the continued need for narcotic medications. Ideally, narcotic medication use should last no more than 6-8 weeks (coinciding with fracture healing).   As a patient it is your responsibility as well to monitor narcotic medication use and report the amount and frequency you use these medications when you come to your office visit.   We would also advise that if you are using narcotic medications, you should take a dose prior to therapy to maximize you participation.  IF YOU ARE ON NARCOTIC MEDICATIONS IT IS NOT PERMISSIBLE TO OPERATE A MOTOR VEHICLE (MOTORCYCLE/CAR/TRUCK/MOPED) OR HEAVY MACHINERY DO NOT MIX NARCOTICS WITH OTHER CNS (CENTRAL NERVOUS SYSTEM) DEPRESSANTS SUCH AS ALCOHOL   STOP SMOKING OR USING NICOTINE PRODUCTS!!!!  As discussed nicotine severely  impairs your body's ability to heal surgical and traumatic wounds but also impairs bone healing.  Wounds and bone heal by forming microscopic blood vessels (angiogenesis) and nicotine is a vasoconstrictor (essentially, shrinks blood vessels).  Therefore, if vasoconstriction occurs to these microscopic blood vessels they essentially disappear and are unable to deliver necessary nutrients to the healing tissue.  This is one modifiable factor that you can do to dramatically increase your chances of healing your injury.    (This means no smoking, no nicotine gum, patches, etc)  DO NOT USE NONSTEROIDAL ANTI-INFLAMMATORY DRUGS (NSAID'S)  Using products such as Advil (ibuprofen), Aleve (naproxen), Motrin (ibuprofen) for additional pain control during fracture healing can delay and/or prevent the healing response.  If you would like to take over the counter (OTC) medication, Tylenol (acetaminophen) is ok.  However, some narcotic medications that are given for pain control contain acetaminophen as well. Therefore, you should not exceed more than 4000 mg of tylenol in a day if you do not have liver disease.  Also note that there are may OTC medicines, such as cold medicines and allergy medicines that my contain tylenol as well.  If you have any questions about medications and/or interactions please ask your doctor/PA or your pharmacist.      ICE AND ELEVATE INJURED/OPERATIVE EXTREMITY  Using ice and elevating the injured extremity above your heart can help with swelling and pain control.  Icing in a pulsatile fashion, such as 20 minutes on and 20 minutes off, can be followed.    Do not place ice directly on skin. Make sure there is a barrier between to skin and the  ice pack.    Using frozen items such as frozen peas works well as the conform nicely to the are that needs to be iced.  USE AN ACE WRAP OR TED HOSE FOR SWELLING CONTROL  In addition to icing and elevation, Ace wraps or TED hose are used to help limit  and resolve swelling.  It is recommended to use Ace wraps or TED hose until you are informed to stop.    When using Ace Wraps start the wrapping distally (farthest away from the body) and wrap proximally (closer to the body)   Example: If you had surgery on your leg or thing and you do not have a splint on, start the ace wrap at the toes and work your way up to the thigh        If you had surgery on your upper extremity and do not have a splint on, start the ace wrap at your fingers and work your way up to the upper arm   CALL THE OFFICE WITH ANY QUESTIONS OR CONCERNS: [phone]   VISIT OUR WEBSITE FOR ADDITIONAL INFORMATION: [email or web address]    Discharge Wound Care Instructions  Do NOT apply any ointments, solutions or lotions to pin sites or surgical wounds.  These prevent needed drainage and even though solutions like hydrogen peroxide kill bacteria, they also damage cells lining the pin sites that help fight infection.  Applying lotions or ointments can keep the wounds moist and can cause them to breakdown and open up as well. This can increase the risk for infection. When in doubt call the office.  Surgical incisions should be dressed daily.  If any drainage is noted, use one layer of adaptic, then gauze, Kerlix, and an ace wrap.  Once the incision is completely dry and without drainage, it may be left open to air out.  Showering may begin 36-48 hours later.  Cleaning gently with soap and water.  Traumatic wounds should be dressed daily as well.    One layer of adaptic, gauze, Kerlix, then ace wrap.  The adaptic can be discontinued once the draining has ceased    If you have a wet to dry dressing: wet the gauze with saline the squeeze as much saline out so the gauze is moist (not soaking wet), place moistened gauze over wound, then place a dry gauze over the moist one, followed by Kerlix wrap, then ace wrap.

## 2020-12-10 NOTE — Anesthesia Preprocedure Evaluation (Addendum)
Anesthesia Evaluation  Patient identified by MRN, date of birth, ID band Patient awake    Reviewed: Allergy & Precautions, NPO status , Patient's Chart, lab work & pertinent test results  Airway Mallampati: III  TM Distance: >3 FB Neck ROM: Full    Dental no notable dental hx. (+) Teeth Intact, Dental Advisory Given, Chipped,    Pulmonary Patient abstained from smoking., former smoker,  Quit smoking 05/2020   Pulmonary exam normal breath sounds clear to auscultation       Cardiovascular + DVT  Normal cardiovascular exam Rhythm:Regular Rate:Normal     Neuro/Psych PSYCHIATRIC DISORDERS Anxiety negative neurological ROS     GI/Hepatic Neg liver ROS, GERD  Medicated and Controlled,  Endo/Other  Obesity BMI 38  Renal/GU negative Renal ROS  negative genitourinary   Musculoskeletal Symptomatic hardware right acetabulum    Abdominal (+) + obese,   Peds  Hematology negative hematology ROS (+)   Anesthesia Other Findings   Reproductive/Obstetrics negative OB ROS                           Anesthesia Physical Anesthesia Plan  ASA: II  Anesthesia Plan: General   Post-op Pain Management:    Induction: Intravenous  PONV Risk Score and Plan: 2 and Ondansetron, Treatment may vary due to age or medical condition and Dexamethasone  Airway Management Planned: LMA  Additional Equipment: None  Intra-op Plan:   Post-operative Plan: Extubation in OR  Informed Consent: I have reviewed the patients History and Physical, chart, labs and discussed the procedure including the risks, benefits and alternatives for the proposed anesthesia with the patient or authorized representative who has indicated his/her understanding and acceptance.     Dental advisory given  Plan Discussed with: CRNA  Anesthesia Plan Comments:         Anesthesia Quick Evaluation

## 2020-12-10 NOTE — Anesthesia Postprocedure Evaluation (Signed)
Anesthesia Post Note  Patient: Jason Walter  Procedure(s) Performed: HARDWARE REMOVAL (Right Hip)     Patient location during evaluation: PACU Anesthesia Type: General Level of consciousness: awake and alert, oriented and patient cooperative Pain management: pain level controlled Vital Signs Assessment: post-procedure vital signs reviewed and stable Respiratory status: spontaneous breathing, nonlabored ventilation and respiratory function stable Cardiovascular status: blood pressure returned to baseline and stable Postop Assessment: no apparent nausea or vomiting Anesthetic complications: no   No complications documented.  Last Vitals:  Vitals:   12/10/20 1209 12/10/20 1224  BP: (!) 124/54 137/70  Pulse: 89 95  Resp: 14 14  Temp: (!) 36.2 C (!) 36.2 C  SpO2: 97% 100%    Last Pain:  Vitals:   12/10/20 1254  TempSrc:   PainSc: 3                  Lannie Fields

## 2020-12-10 NOTE — Anesthesia Procedure Notes (Signed)
Procedure Name: Intubation Date/Time: 12/10/2020 8:50 AM Performed by: Onnie Boer, CRNA Pre-anesthesia Checklist: Patient identified, Emergency Drugs available, Suction available and Patient being monitored Patient Re-evaluated:Patient Re-evaluated prior to induction Oxygen Delivery Method: Circle system utilized Preoxygenation: Pre-oxygenation with 100% oxygen Induction Type: IV induction Ventilation: Mask ventilation without difficulty Laryngoscope Size: Miller and 2 Grade View: Grade III Tube type: Oral Number of attempts: 1 Airway Equipment and Method: Stylet and Oral airway Placement Confirmation: ETT inserted through vocal cords under direct vision,  positive ETCO2 and breath sounds checked- equal and bilateral Tube secured with: Tape Dental Injury: Teeth and Oropharynx as per pre-operative assessment

## 2020-12-10 NOTE — Transfer of Care (Signed)
Immediate Anesthesia Transfer of Care Note  Patient: Jason Walter  Procedure(s) Performed: HARDWARE REMOVAL (Right Hip)  Patient Location: PACU  Anesthesia Type:General  Level of Consciousness: awake, oriented, drowsy, patient cooperative and responds to stimulation  Airway & Oxygen Therapy: Patient Spontanous Breathing  Post-op Assessment: Report given to RN, Post -op Vital signs reviewed and stable, Patient moving all extremities, Patient moving all extremities X 4 and Patient able to stick tongue midline  Post vital signs: Reviewed and stable  Last Vitals:  Vitals Value Taken Time  BP 147/85 12/10/20 1111  Temp    Pulse 92 12/10/20 1116  Resp 15 12/10/20 1116  SpO2 100 % 12/10/20 1116  Vitals shown include unvalidated device data.  Last Pain:  Vitals:   12/10/20 0722  TempSrc: Oral  PainSc:       Patients Stated Pain Goal: 2 (12/10/20 0705)  Complications: No complications documented.

## 2020-12-10 NOTE — Interval H&P Note (Signed)
History and Physical Interval Note:  12/10/2020 8:10 AM  Jason Walter  has presented today for surgery, with the diagnosis of SYMPTOMATIC HARDWARE RT ACETABULUM.  The various methods of treatment have been discussed with the patient and family. After consideration of risks, benefits and other options for treatment, the patient has consented to  Procedure(s): HARDWARE REMOVAL (Right) as a surgical intervention.  The patient's history has been reviewed, patient examined, no change in status, stable for surgery.  I have reviewed the patient's chart and labs.  Questions were answered to the patient's satisfaction.     Caryn Bee P Cleophas Yoak

## 2020-12-11 ENCOUNTER — Encounter (HOSPITAL_COMMUNITY): Payer: Self-pay | Admitting: Student

## 2021-07-14 ENCOUNTER — Encounter (HOSPITAL_BASED_OUTPATIENT_CLINIC_OR_DEPARTMENT_OTHER): Payer: Self-pay | Admitting: Orthopedic Surgery

## 2021-07-14 ENCOUNTER — Other Ambulatory Visit: Payer: Self-pay

## 2021-07-21 NOTE — H&P (Signed)
Jason Walter is an 35 y.o. male.   Chief Complaint: LEFT FINGER POPPING  HPI: the patient is a 35 year old right-hand dominant male who has had clicking in the left long finger starting in January 2022.  The pain is continued to increase despite having cortisone injections which helped alleviate the symptoms for the short-term.  He continues to have weakness, tingling, swelling, catching, and locking of the finger.  He would like a more permanent resolution and would like surgery. He is here today for surgery. He denies chest pain, shortness of breath, fever, chills, nausea, vomiting, diarrhea.  Past Medical History:  Diagnosis Date   Allergy    DVT (deep venous thrombosis) (HCC)    GERD (gastroesophageal reflux disease)    at times   PTSD (post-traumatic stress disorder)     Past Surgical History:  Procedure Laterality Date   HARDWARE REMOVAL Right 12/10/2020   Procedure: HARDWARE REMOVAL;  Surgeon: Roby Lofts, MD;  Location: [facility] OR;  Service: Orthopedics;  Laterality: Right;   OPEN REDUCTION INTERNAL FIXATION ACETABULUM FRACTURE POSTERIOR Right 05/30/2020   Procedure: OPEN REDUCTION INTERNAL FIXATION ACETABULUM FRACTURE POSTERIOR;  Surgeon: Roby Lofts, MD;  Location: [facility] OR;  Service: Orthopedics;  Laterality: Right;   ORIF ACETABULUM FRACTURE  05/30/2020   CPT 27228-Open reduction internal fixation of right transverse-posterior wall acetabular fracture   WRIST SURGERY Left     Family History  Problem Relation Age of Onset   Diabetes Mother    Brain cancer Other    Social History:  reports that he quit smoking about 13 months ago. His smoking use included cigarettes. He has a 0.20 pack-year smoking history. He has never used smokeless tobacco. He reports current alcohol use. He reports that he does not use drugs.  Allergies:  Allergies  Allergen Reactions   Molybdenum     Positive patch test   Nickel     Positive patch test     No medications prior to  admission.    No results found for this or any previous visit (from the past 48 hour(s)). No results found.  ROS NO RECENT ILLNESSES OR HOSPITALIZATIONS   Height 5\' 11"  (1.803 m), weight 127.9 kg. Physical Exam  General Appearance:  Alert, cooperative, no distress, appears stated age  Head:  Normocephalic, without obvious abnormality, atraumatic  Eyes:  Pupils equal, conjunctiva/corneas clear,         Throat: Lips, mucosa, and tongue normal; teeth and gums normal  Neck: No visible masses     Lungs:   respirations unlabored  Chest Wall:  No tenderness or deformity  Heart:  Regular rate and rhythm,  Abdomen:   Soft, non-tender,         Extremities: LUE - MILD SWELLING OF THE LEFT HAND WITHOUT ERYTHEMA OR DRAINAGE. SENSATION INTACT TO LIGHT TOUCH DISTALLY. CAPILLARY REFILL LESS THAN 2 SECONDS. TENDERNESS AT THE A1 PULLEY OF THE LONG FINGER WITH ACTIVE TRIGGERING.  Pulses: 2+ and symmetric  Skin: Skin color, texture, turgor normal, no rashes or lesions     Neurologic: Normal     Assessment/Plan TRIGGER FINGER OF LEFT LONG FINGER     - LEFT LONG FINGER A1 PULLEY RELEASE    WE ARE PLANNING SURGERY FOR YOUR UPPER EXTREMITY. THE RISKS AND BENEFITS OF SURGERY INCLUDE BUT NOT LIMITED TO BLEEDING INFECTION, DAMAGE TO NEARBY NERVES ARTERIES TENDONS, FAILURE OF SURGERY TO ACCOMPLISH ITS INTENDED GOALS, PERSISTENT SYMPTOMS AND NEED FOR FURTHER SURGICAL INTERVENTION. WITH THIS IN MIND WE  WILL PROCEED. I HAVE DISCUSSED WITH THE PATIENT THE PRE AND POSTOPERATIVE REGIMEN AND THE DOS AND DON'TS. PT VOICED UNDERSTANDING AND INFORMED CONSENT SIGNED.    R/B/A DISCUSSED WITH PT IN OFFICE.  PT VOICED UNDERSTANDING OF PLAN CONSENT SIGNED DAY OF SURGERY PT SEEN AND EXAMINED PRIOR TO OPERATIVE PROCEDURE/DAY OF SURGERY SITE MARKED. QUESTIONS ANSWERED WILL GO HOME FOLLOWING SURGERY   Madeleyn Schwimmer [facility] MD     Karma Greaser 07/21/2021, 8:56 AM

## 2021-07-22 ENCOUNTER — Ambulatory Visit (HOSPITAL_BASED_OUTPATIENT_CLINIC_OR_DEPARTMENT_OTHER)
Admission: RE | Admit: 2021-07-22 | Discharge: 2021-07-22 | Disposition: A | Discharge status: Home / Self Care | Payer: 59 | Attending: Orthopedic Surgery | Admitting: Orthopedic Surgery

## 2021-07-22 ENCOUNTER — Encounter (HOSPITAL_BASED_OUTPATIENT_CLINIC_OR_DEPARTMENT_OTHER)
Admission: RE | Disposition: A | Discharge status: Home / Self Care | Payer: Self-pay | Source: Home / Self Care | Attending: Orthopedic Surgery

## 2021-07-22 ENCOUNTER — Ambulatory Visit (HOSPITAL_BASED_OUTPATIENT_CLINIC_OR_DEPARTMENT_OTHER): Payer: 59 | Admitting: Anesthesiology

## 2021-07-22 ENCOUNTER — Encounter (HOSPITAL_BASED_OUTPATIENT_CLINIC_OR_DEPARTMENT_OTHER): Payer: Self-pay | Admitting: Orthopedic Surgery

## 2021-07-22 DIAGNOSIS — Z87891 Personal history of nicotine dependence: Secondary | ICD-10-CM | POA: Diagnosis not present

## 2021-07-22 DIAGNOSIS — M65332 Trigger finger, left middle finger: Secondary | ICD-10-CM | POA: Diagnosis not present

## 2021-07-22 DIAGNOSIS — Z889 Allergy status to unspecified drugs, medicaments and biological substances status: Secondary | ICD-10-CM | POA: Diagnosis not present

## 2021-07-22 DIAGNOSIS — Z86718 Personal history of other venous thrombosis and embolism: Secondary | ICD-10-CM | POA: Insufficient documentation

## 2021-07-22 HISTORY — PX: TRIGGER FINGER RELEASE: SHX641

## 2021-07-22 SURGERY — MINOR RELEASE TRIGGER FINGER/A-1 PULLEY
Anesthesia: LOCAL | Site: Hand | Laterality: Left

## 2021-07-22 MED ORDER — MIDAZOLAM HCL 2 MG/2ML IJ SOLN
INTRAMUSCULAR | Status: AC
  Filled 2021-07-22: qty 2

## 2021-07-22 MED ORDER — LIDOCAINE HCL 1 % IJ SOLN
INTRAMUSCULAR | Status: DC | PRN
  Administered 2021-07-22: 14 mL via SUBCUTANEOUS

## 2021-07-22 MED ORDER — CEFAZOLIN SODIUM-DEXTROSE 2-4 GM/100ML-% IV SOLN
INTRAVENOUS | Status: AC
  Filled 2021-07-22: qty 100

## 2021-07-22 MED ORDER — HYDROCODONE-ACETAMINOPHEN 5-325 MG PO TABS: 1.0000 | ORAL_TABLET | Freq: Four times a day (QID) | ORAL | 0 refills | Status: AC | PRN

## 2021-07-22 MED ORDER — LACTATED RINGERS IV SOLN: INTRAVENOUS | Status: DC

## 2021-07-22 MED ORDER — FENTANYL CITRATE (PF) 100 MCG/2ML IJ SOLN
INTRAMUSCULAR | Status: AC
  Filled 2021-07-22: qty 2

## 2021-07-22 MED ORDER — BUPIVACAINE HCL (PF) 0.5 % IJ SOLN
INTRAMUSCULAR | Status: AC
  Filled 2021-07-22: qty 30

## 2021-07-22 MED ORDER — LIDOCAINE HCL (PF) 1 % IJ SOLN
INTRAMUSCULAR | Status: AC
  Filled 2021-07-22: qty 30

## 2021-07-22 MED ORDER — PROPOFOL 500 MG/50ML IV EMUL
INTRAVENOUS | Status: AC
  Filled 2021-07-22: qty 50

## 2021-07-22 MED ORDER — CEFAZOLIN IN SODIUM CHLORIDE 3-0.9 GM/100ML-% IV SOLN
3.0000 g | INTRAVENOUS | Status: DC
Start: 2021-07-23 — End: 2021-07-22

## 2021-07-22 MED ORDER — CEFAZOLIN SODIUM-DEXTROSE 1-4 GM/50ML-% IV SOLN
INTRAVENOUS | Status: AC
  Filled 2021-07-22: qty 50

## 2021-07-22 MED ORDER — BUPIVACAINE HCL (PF) 0.25 % IJ SOLN
INTRAMUSCULAR | Status: AC
  Filled 2021-07-22: qty 30

## 2021-07-22 MED ORDER — 0.9 % SODIUM CHLORIDE (POUR BTL) OPTIME
TOPICAL | Status: DC | PRN
  Administered 2021-07-22: 120 mL

## 2021-07-22 SURGICAL SUPPLY — 41 items
BLADE SURG 15 STRL LF DISP TIS (BLADE) ×2 IMPLANT
BLADE SURG 15 STRL SS (BLADE) ×3
BNDG CMPR 9X4 STRL LF SNTH (GAUZE/BANDAGES/DRESSINGS) ×2
BNDG ELASTIC 3X5.8 VLCR STR LF (GAUZE/BANDAGES/DRESSINGS) ×3 IMPLANT
BNDG ESMARK 4X9 LF (GAUZE/BANDAGES/DRESSINGS) ×3 IMPLANT
BNDG GAUZE ELAST 4 BULKY (GAUZE/BANDAGES/DRESSINGS) ×3 IMPLANT
CORD BIPOLAR FORCEPS 12FT (ELECTRODE) ×3 IMPLANT
COVER BACK TABLE 60X90IN (DRAPES) ×3 IMPLANT
COVER MAYO STAND STRL (DRAPES) ×3 IMPLANT
CUFF TOURN SGL QUICK 18X4 (TOURNIQUET CUFF) ×3 IMPLANT
DRAPE EXTREMITY T 121X128X90 (DISPOSABLE) ×3 IMPLANT
DRAPE SURG 17X23 STRL (DRAPES) ×3 IMPLANT
GAUZE 4X4 16PLY ~~LOC~~+RFID DBL (SPONGE) ×3 IMPLANT
GAUZE SPONGE 4X4 12PLY STRL LF (GAUZE/BANDAGES/DRESSINGS) ×3 IMPLANT
GAUZE XEROFORM 1X8 LF (GAUZE/BANDAGES/DRESSINGS) ×3 IMPLANT
GLOVE SURG ENC MOIS LTX SZ6.5 (GLOVE) ×3 IMPLANT
GLOVE SURG ENC MOIS LTX SZ8 (GLOVE) ×3 IMPLANT
GLOVE SURG POLYISO LF SZ6.5 (GLOVE) ×3 IMPLANT
GLOVE SURG POLYISO LF SZ7 (GLOVE) ×3 IMPLANT
GLOVE SURG UNDER POLY LF SZ6.5 (GLOVE) ×3 IMPLANT
GLOVE SURG UNDER POLY LF SZ7 (GLOVE) ×9 IMPLANT
GLOVE SURG UNDER POLY LF SZ8.5 (GLOVE) ×3 IMPLANT
GOWN STRL REUS W/ TWL LRG LVL3 (GOWN DISPOSABLE) ×6 IMPLANT
GOWN STRL REUS W/ TWL XL LVL3 (GOWN DISPOSABLE) ×2 IMPLANT
GOWN STRL REUS W/TWL LRG LVL3 (GOWN DISPOSABLE) ×9
GOWN STRL REUS W/TWL XL LVL3 (GOWN DISPOSABLE) ×3
NEEDLE HYPO 25X1 1.5 SAFETY (NEEDLE) ×3 IMPLANT
NS IRRIG 1000ML POUR BTL (IV SOLUTION) ×3 IMPLANT
PACK BASIN DAY SURGERY FS (CUSTOM PROCEDURE TRAY) ×3 IMPLANT
PAD ALCOHOL SWAB (MISCELLANEOUS) ×12 IMPLANT
PAD CAST 3X4 CTTN HI CHSV (CAST SUPPLIES) ×2 IMPLANT
PADDING CAST ABS 3INX4YD NS (CAST SUPPLIES)
PADDING CAST ABS COTTON 3X4 (CAST SUPPLIES) IMPLANT
PADDING CAST COTTON 3X4 STRL (CAST SUPPLIES) ×3
SLING ARM FOAM STRAP LRG (SOFTGOODS) ×3 IMPLANT
STOCKINETTE 4X48 STRL (DRAPES) ×3 IMPLANT
SUT PROLENE 4 0 PS 2 18 (SUTURE) ×3 IMPLANT
SYR BULB EAR ULCER 3OZ GRN STR (SYRINGE) ×3 IMPLANT
SYR CONTROL 10ML LL (SYRINGE) ×3 IMPLANT
TOWEL GREEN STERILE FF (TOWEL DISPOSABLE) ×3 IMPLANT
UNDERPAD 30X36 HEAVY ABSORB (UNDERPADS AND DIAPERS) ×3 IMPLANT

## 2021-07-22 NOTE — Progress Notes (Signed)
Patient has breakfast this am at 0900 egg biscuit. Per Dr. Hyacinth Meeker, will need to r/s surgery.

## 2021-07-22 NOTE — Discharge Instructions (Addendum)
KEEP BANDAGE CLEAN AND DRY CALL OFFICE FOR F/U APPT [phone] IN 13 DAYS HYDROCODONE 5-325MG  1 TABLET PO Q 6  HOURS PRN PAIN SENT TO THE [facility] KEEP HAND ELEVATED ABOVE HEART OK TO APPLY ICE TO OPERATIVE AREA CONTACT OFFICE IF ANY WORSENING PAIN OR CONCERNS.

## 2021-07-22 NOTE — Op Note (Signed)
PREOPERATIVE DIAGNOSIS:Left long finger trigger finger  POSTOPERATIVE DIAGNOSIS:Left long finger trigger finger  ATTENDING SURGEON: Dr. Bradly Bienenstock who scrubbed and present for the entire procedure  ASSISTANT SURGEON: None  ANESTHESIA: 1% Xylocaine quarter percent Marcaine local block with IV sedation  OPERATIVE PROCEDURE: Left long finger A1 pulley release  IMPLANTS: None  EBL: Minimal  RADIOGRAPHIC INTERPRETATION: None  SURGICAL INDICATIONS: Patient is a right-hand-dominant gentleman with a persistent pain and clicking to the left long finger.  Patient elected undergo the above procedure.  The risks of surgery include but not limited to bleeding infection damage nearby nerves arteries or tendons persistent pain loss of motion of the finger need for further surgical invention.  SURGICAL TECHNIQUE: Patient was palpated via the preoperative holding area marked apart a marker made the left long finger to indicate correct operative site.  Patient brought back operating placed supine on anesthesia table where the well-padded tourniquet placed on the left forearm.  The local anesthetic was administered 1% Xylocaine quarter percent Marcaine local block.  The hand was then prepped and draped in normal sterile fashion.  A timeout was called the correct site identified procedure then begun.  Attention then turned to the long finger and oblique incision was made directly over the A1 pulley.  Dissection carried down through the skin and subcutaneous tissue.  The A1 pulley was then identified and under direct visualization the A1 pulley was then released proximally distally.  The flexor sheath was then explored.  The patient had a moderate inflammatory changes along the course of the tendons.  Tenosynovectomy was carried out.  The wound was then thoroughly irrigated.  The patient is able to actively engage in flexing extending of the finger there was no catching popping or locking.  Skin was then closed  using horizontal mattress Prolene sutures.  Xeroform dressing sterile compressive bandage then applied.  The patient tolerated the procedure well returned to recovery room in good condition.  POSTOPERATIVE PLAN: Patient be discharged to home.  See him back in the office in 12 to 14 days for wound check suture removal gradual use and activity.

## 2021-07-23 ENCOUNTER — Encounter (HOSPITAL_BASED_OUTPATIENT_CLINIC_OR_DEPARTMENT_OTHER): Payer: Self-pay | Admitting: Orthopedic Surgery

## 2021-08-28 IMAGING — CT CT HIP*R* W/O CM
1 series · 15 of 32 positions shown, 19 images · non-contrast
Comparison: CT 05/30/2020

CLINICAL DATA: Right acetabular fracture.  Evaluate healing

EXAM:
CT OF THE RIGHT HIP WITHOUT CONTRAST
TECHNIQUE: Multidetector CT imaging of the right hip was performed according to
the standard protocol. Multiplanar CT image reconstructions were
also generated.

[Series 3: soft tissue pelvis/hip (person_name) · axial · 0.54mm/px · z∈[-344,-143]mm · 15 of 75 slices shown, 19 images]
[im 5/75  soft-tissue]
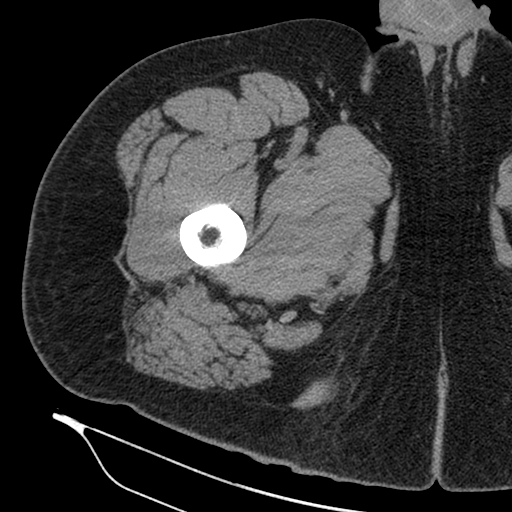
[im 5/75  bone]
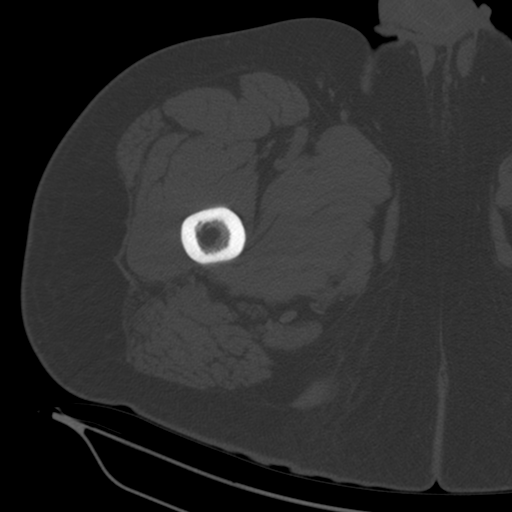
[im 10/75  soft-tissue]
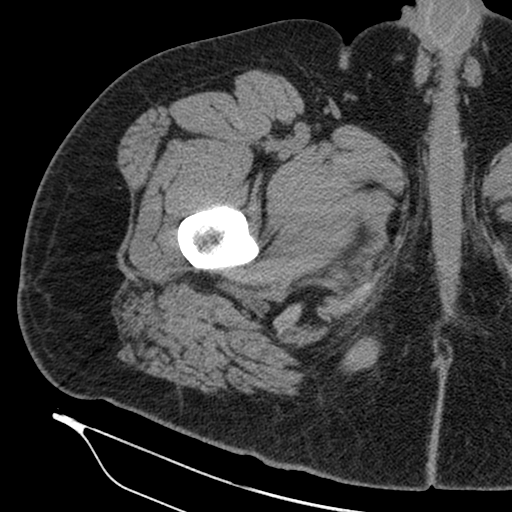
[im 15/75  soft-tissue]
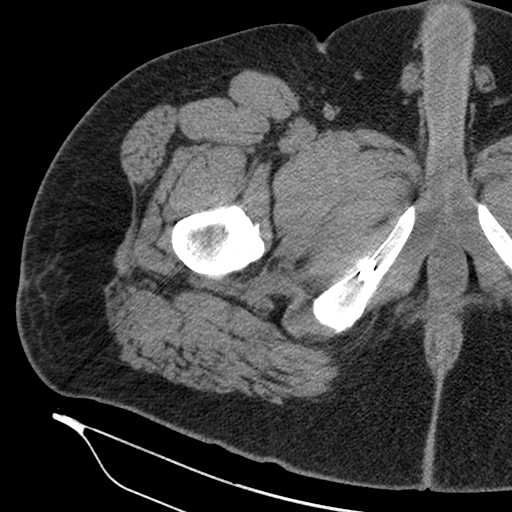
[im 22/75  soft-tissue]
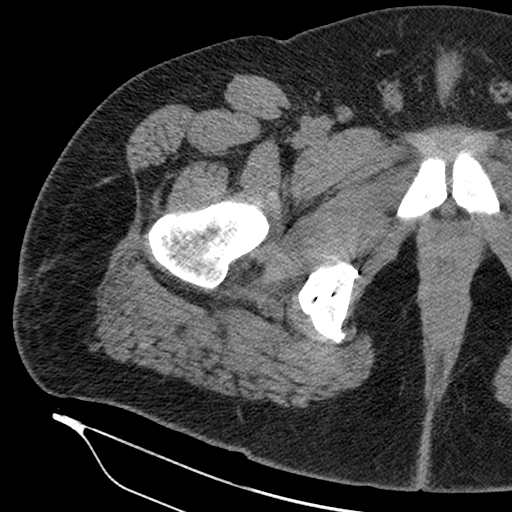
[im 27/75  soft-tissue]
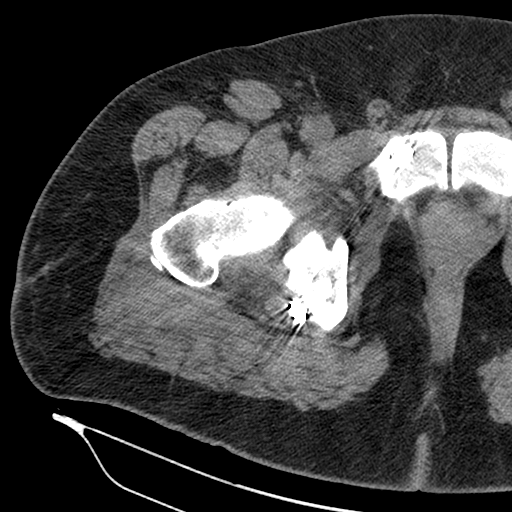
[im 32/75  soft-tissue]
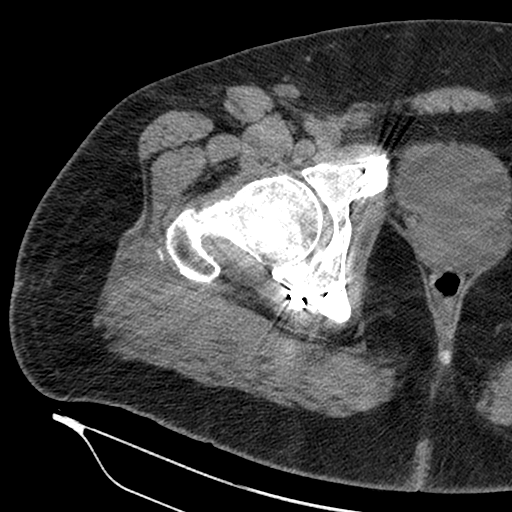
[im 39/75  soft-tissue]
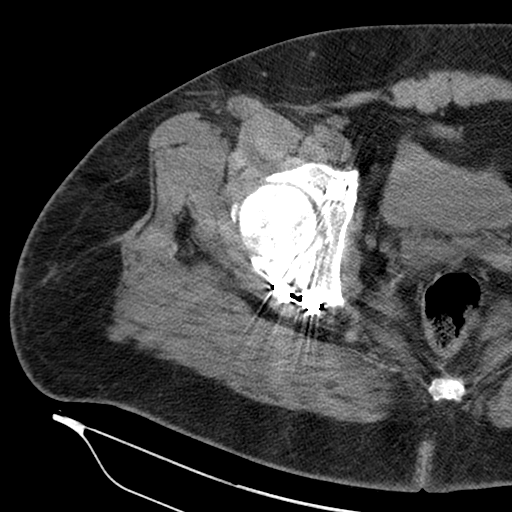
[im 43/75  soft-tissue]
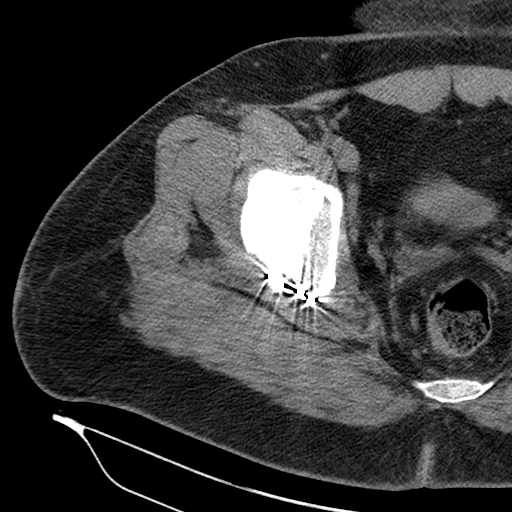
[im 48/75  soft-tissue]
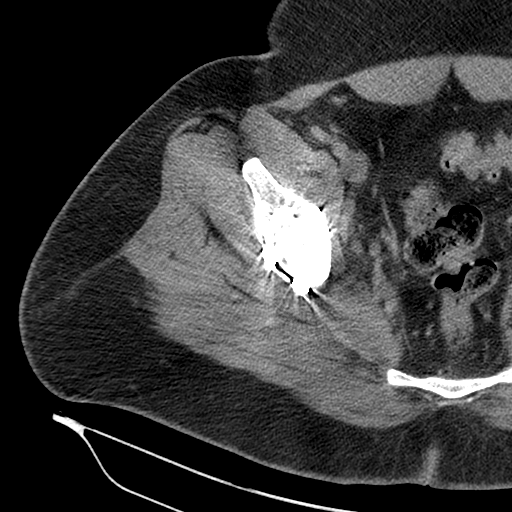
[im 48/75  bone]
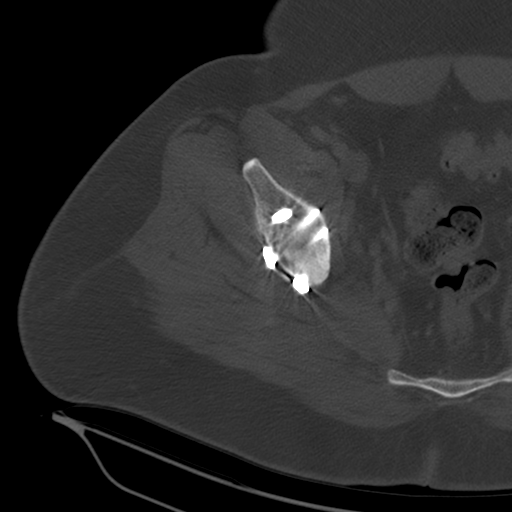
[im 53/75  soft-tissue]
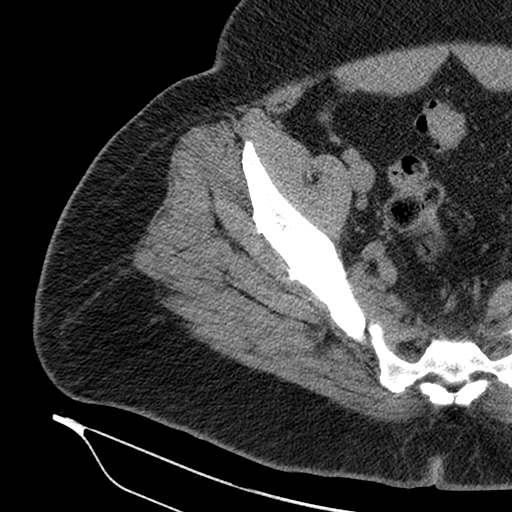
[im 60/75  soft-tissue]
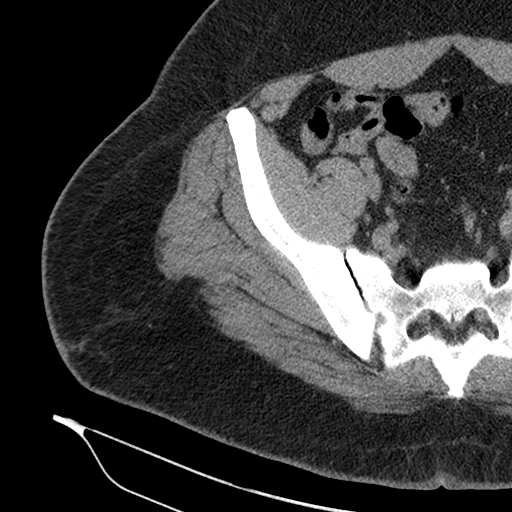
[im 65/75  soft-tissue]
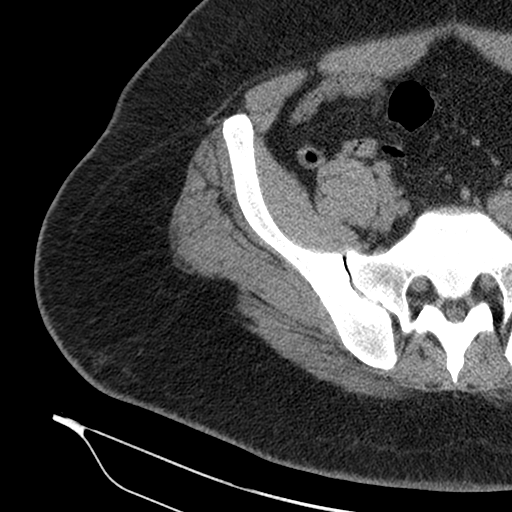
[im 65/75  lung]
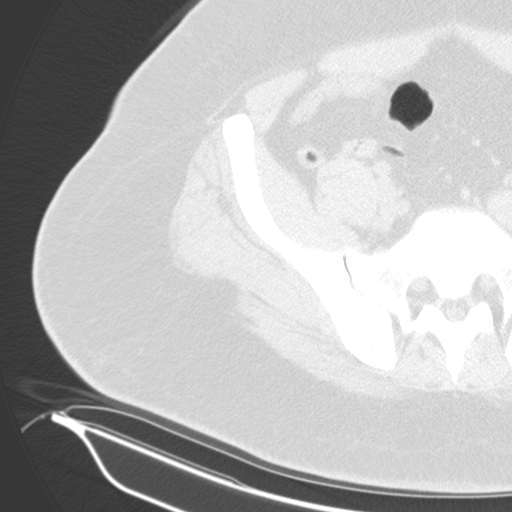
[im 67/75  lung]
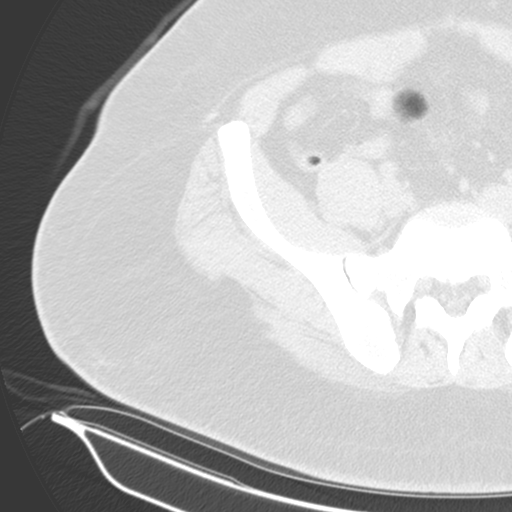
[im 70/75  soft-tissue]
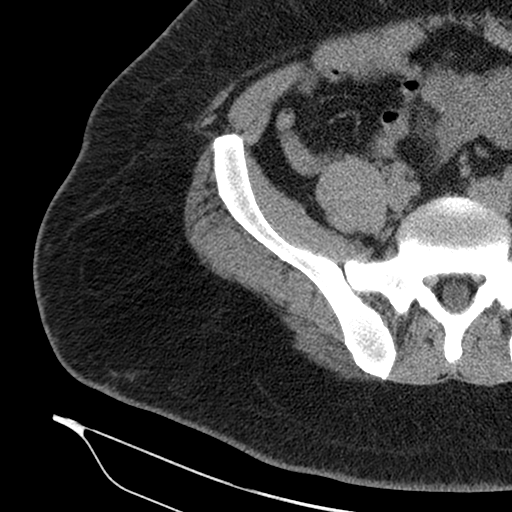
[im 70/75  lung]
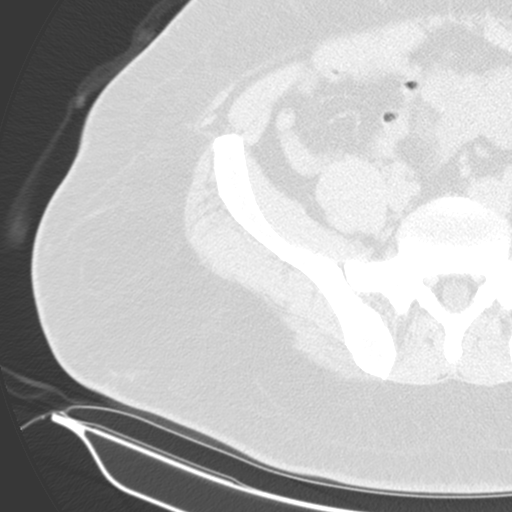
[im 72/75  lung]
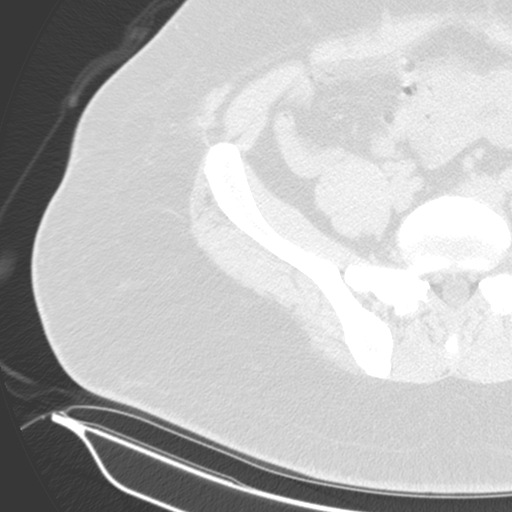

[15 of 32 positions shown; findings below may reference images not displayed]

FINDINGS: Bones/Joint/Cartilage

Plate and screw fixation complex related to prior comminuted right
acetabular fracture. Hardware appears intact without evidence of
hardware failure or loosening. Metallic streak artifact from
hardware slightly degrades evaluation of the adjacent structures.
Subtle residual fracture lucency is evident at the acetabular roof
and posteroinferior acetabulum, although there appears to be
bridging bone formation across the fracture sites. Fracture
alignment is unchanged and remains anatomic. No new fractures. Hip
joint is intact. 5 mm small bone fragment located at the lateral
aspect of the hip joint (series 6, image 49) additional small 4 mm
bony fragment along the inferior aspect of the hip joint (series 6,
image 52). Small cortical avulsion fragment adjacent to the right
greater trochanter (series 2, image 47) likely related to gluteus
medius insertion. No appreciable hip joint effusion.

Right SI joint and pubic symphysis are intact.  No pelvic diastasis.

Ligaments

Suboptimally assessed by CT.

Muscles and Tendons

No acute musculotendinous abnormality by CT.

Soft tissues

Previously seen small postoperative hematoma has resolved. Soft
tissues are unremarkable.
IMPRESSION: 1. Plate and screw fixation complex related to prior comminuted
right acetabular fracture. Hardware appears intact without evidence
of hardware failure or loosening. Subtle residual fracture lucency
is evident at the acetabular roof and posteroinferior acetabulum,
although there appears to be bridging bone formation across the
fracture sites. Fracture alignment is unchanged and remains
anatomic.
2. Two small intra-articular bony fragments within the hip joint. No
sizable hip joint effusion.
3. Small cortical avulsion fragment adjacent to the right greater
trochanter likely related to the gluteus medius insertion.

## 2022-05-06 ENCOUNTER — Ambulatory Visit
Admission: EM | Admit: 2022-05-06 | Discharge: 2022-05-06 | Disposition: A | Discharge status: Home / Self Care | Payer: Self-pay | Attending: Internal Medicine | Admitting: Internal Medicine

## 2022-05-06 DIAGNOSIS — Z113 Encounter for screening for infections with a predominantly sexual mode of transmission: Secondary | ICD-10-CM | POA: Insufficient documentation

## 2022-05-06 NOTE — ED Triage Notes (Signed)
Patient presents to Urgent Care with complaints of recent exposure to a sexual partner that st they had an itch . Patient reports no symptoms at the morning.

## 2022-05-06 NOTE — Discharge Instructions (Signed)
Your STD test is pending.  We will call if anything is abnormal and treat as appropriate.  Please refrain from sexual activity until test results and treatment are complete.
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
## 2022-05-10 LAB — CYTOLOGY, (ORAL, ANAL, URETHRAL) ANCILLARY ONLY
Chlamydia: NEGATIVE
Comment: NEGATIVE
Comment: NEGATIVE
Comment: NORMAL
Neisseria Gonorrhea: NEGATIVE
Trichomonas: NEGATIVE

## 2023-05-30 ENCOUNTER — Emergency Department (HOSPITAL_COMMUNITY): Payer: Medicaid Other

## 2023-05-30 ENCOUNTER — Emergency Department (HOSPITAL_COMMUNITY)
Admission: EM | Admit: 2023-05-30 | Discharge: 2023-05-30 | Disposition: A | Discharge status: Home / Self Care | Payer: Medicaid Other | Attending: Emergency Medicine | Admitting: Emergency Medicine

## 2023-05-30 DIAGNOSIS — T07XXXA Unspecified multiple injuries, initial encounter: Secondary | ICD-10-CM

## 2023-05-30 DIAGNOSIS — Y92410 Unspecified street and highway as the place of occurrence of the external cause: Secondary | ICD-10-CM | POA: Insufficient documentation

## 2023-05-30 DIAGNOSIS — Z043 Encounter for examination and observation following other accident: Secondary | ICD-10-CM | POA: Diagnosis not present

## 2023-05-30 DIAGNOSIS — S30810A Abrasion of lower back and pelvis, initial encounter: Secondary | ICD-10-CM | POA: Diagnosis not present

## 2023-05-30 DIAGNOSIS — S50312A Abrasion of left elbow, initial encounter: Secondary | ICD-10-CM | POA: Diagnosis not present

## 2023-05-30 DIAGNOSIS — S76012A Strain of muscle, fascia and tendon of left hip, initial encounter: Secondary | ICD-10-CM | POA: Diagnosis not present

## 2023-05-30 DIAGNOSIS — M1611 Unilateral primary osteoarthritis, right hip: Secondary | ICD-10-CM | POA: Diagnosis not present

## 2023-05-30 DIAGNOSIS — S79912A Unspecified injury of left hip, initial encounter: Secondary | ICD-10-CM | POA: Diagnosis not present

## 2023-05-30 MED ORDER — ACETAMINOPHEN 500 MG PO TABS
1000.0000 mg | ORAL_TABLET | Freq: Once | ORAL | Status: AC
  Administered 2023-05-30: 1000 mg via ORAL
  Filled 2023-05-30: qty 2

## 2023-05-30 MED ORDER — OXYCODONE HCL 5 MG PO TABS
10.0000 mg | ORAL_TABLET | Freq: Once | ORAL | Status: AC
  Administered 2023-05-30: 10 mg via ORAL
  Filled 2023-05-30: qty 2

## 2023-05-30 MED ORDER — OXYCODONE HCL 5 MG PO TABS: 5.0000 mg | ORAL_TABLET | Freq: Four times a day (QID) | ORAL | 0 refills | Status: AC | PRN
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
## 2023-05-30 NOTE — ED Notes (Signed)
This RN notified Jearld Fenton MD of pt

## 2023-05-30 NOTE — ED Notes (Signed)
Discharge instructions discussed with pt. Verbalized understanding. VSS. No questions or concerns regarding discharge  

## 2023-08-09 ENCOUNTER — Ambulatory Visit
Admission: EM | Admit: 2023-08-09 | Discharge: 2023-08-09 | Disposition: A | Discharge status: Home / Self Care | Payer: Medicaid Other

## 2023-08-09 DIAGNOSIS — M79632 Pain in left forearm: Secondary | ICD-10-CM | POA: Diagnosis not present

## 2023-08-09 MED ORDER — CYCLOBENZAPRINE HCL 10 MG PO TABS: 10.0000 mg | ORAL_TABLET | Freq: Two times a day (BID) | ORAL | 0 refills | Status: AC | PRN

## 2023-08-09 MED ORDER — PREDNISONE 20 MG PO TABS: 40.0000 mg | ORAL_TABLET | Freq: Every day | ORAL | 0 refills | Status: AC

## 2023-08-09 NOTE — ED Triage Notes (Signed)
"  I was at work and my left wrist and moring up my forearm started swelling". "I do have a history of surgery on my left hand for trigger finger". "I can barely pick up and move my left hand right now".
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
## 2023-11-04 ENCOUNTER — Ambulatory Visit
Admission: EM | Admit: 2023-11-04 | Discharge: 2023-11-04 | Disposition: A | Discharge status: Home / Self Care | Payer: Medicaid Other | Attending: Physician Assistant | Admitting: Physician Assistant

## 2023-11-04 ENCOUNTER — Encounter: Payer: Self-pay | Admitting: Emergency Medicine

## 2023-11-04 ENCOUNTER — Other Ambulatory Visit: Payer: Self-pay

## 2023-11-04 DIAGNOSIS — J069 Acute upper respiratory infection, unspecified: Secondary | ICD-10-CM | POA: Diagnosis not present

## 2023-11-04 LAB — POC COVID19/FLU A&B COMBO
Covid Antigen, POC: NEGATIVE
Influenza A Antigen, POC: NEGATIVE
Influenza B Antigen, POC: NEGATIVE

## 2023-11-04 NOTE — ED Triage Notes (Addendum)
Pt reports nasal congestion and dry cough x2 days. Pt thinks he had fevers at home, but did not have thermometer to check. Some relief with OTC medications. Pt will need note for work.
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
## 2023-11-09 ENCOUNTER — Encounter: Payer: Self-pay | Admitting: Physician Assistant

## 2023-12-26 ENCOUNTER — Ambulatory Visit
Admission: EM | Admit: 2023-12-26 | Discharge: 2023-12-26 | Disposition: A | Discharge status: Home / Self Care | Payer: 59 | Attending: Physician Assistant | Admitting: Physician Assistant

## 2023-12-26 ENCOUNTER — Other Ambulatory Visit: Payer: Self-pay

## 2023-12-26 ENCOUNTER — Encounter: Payer: Self-pay | Admitting: *Deleted

## 2023-12-26 DIAGNOSIS — R509 Fever, unspecified: Secondary | ICD-10-CM

## 2023-12-26 DIAGNOSIS — R112 Nausea with vomiting, unspecified: Secondary | ICD-10-CM | POA: Diagnosis not present

## 2023-12-26 DIAGNOSIS — J101 Influenza due to other identified influenza virus with other respiratory manifestations: Secondary | ICD-10-CM

## 2023-12-26 DIAGNOSIS — R197 Diarrhea, unspecified: Secondary | ICD-10-CM

## 2023-12-26 LAB — POCT INFLUENZA A/B
Influenza A, POC: POSITIVE — AB
Influenza B, POC: NEGATIVE

## 2023-12-26 MED ORDER — OSELTAMIVIR PHOSPHATE 75 MG PO CAPS: 75.0000 mg | ORAL_CAPSULE | Freq: Two times a day (BID) | ORAL | 0 refills | Status: AC

## 2023-12-26 MED ORDER — ONDANSETRON 4 MG PO TBDP
4.0000 mg | ORAL_TABLET | Freq: Once | ORAL | Status: AC
  Administered 2023-12-26: 4 mg via ORAL

## 2023-12-26 MED ORDER — PROMETHAZINE-DM 6.25-15 MG/5ML PO SYRP: 5.0000 mL | ORAL_SOLUTION | Freq: Four times a day (QID) | ORAL | 0 refills | Status: AC | PRN

## 2023-12-26 MED ORDER — ONDANSETRON 4 MG PO TBDP: 4.0000 mg | ORAL_TABLET | Freq: Three times a day (TID) | ORAL | 0 refills | Status: AC | PRN

## 2023-12-26 MED ORDER — ACETAMINOPHEN 325 MG PO TABS
975.0000 mg | ORAL_TABLET | Freq: Once | ORAL | Status: AC
  Administered 2023-12-26: 975 mg via ORAL

## 2023-12-26 MED ORDER — IBUPROFEN 800 MG PO TABS
800.0000 mg | ORAL_TABLET | Freq: Once | ORAL | Status: AC
  Administered 2023-12-26: 800 mg via ORAL
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
## 2023-12-26 NOTE — Discharge Instructions (Signed)
You tested positive for influenza.  Start Tamiflu twice daily for 5 days.  Use Promethazine DM for cough.  This will make you sleepy so do not drive drink alcohol with taking it.  Use Zofran for nausea and vomiting symptoms.  Use over-the-counter medication including Mucinex, Flonase, Tylenol, ibuprofen for pain relief.  Make sure that you rest and drink plenty of fluid.  If your symptoms or not improving within a week please return for reevaluation.  If anything worsens and you have high fever not responding to medication, chest pain, shortness of breath, worsening cough, nausea/vomiting interfering with oral intake you need to be seen immediately.  You can return to work/activities once you have been fever free without medication for 24 hours.

## 2023-12-26 NOTE — ED Triage Notes (Addendum)
Cough, subjective fever, sneezing, runny nose, headache x 2 days. His son is positive for flu A

## 2024-08-24 ENCOUNTER — Encounter: Payer: Self-pay | Admitting: *Deleted

## 2024-08-24 ENCOUNTER — Ambulatory Visit
Admission: EM | Admit: 2024-08-24 | Discharge: 2024-08-24 | Disposition: A | Discharge status: Home / Self Care | Source: Ambulatory Visit

## 2024-08-24 ENCOUNTER — Other Ambulatory Visit: Payer: Self-pay

## 2024-08-24 DIAGNOSIS — R369 Urethral discharge, unspecified: Secondary | ICD-10-CM | POA: Insufficient documentation

## 2024-08-24 HISTORY — DX: Essential (primary) hypertension: I10

## 2024-08-24 NOTE — ED Triage Notes (Signed)
 Pt reports penile discharge x 1 week. Voices concern for STI

## 2024-08-24 NOTE — Discharge Instructions (Addendum)
  1. Penile discharge (Primary) - All testing results will take 1 to 3 days for completion if there are any abnormalities noted on the final report you will be contacted and appropriate guidance provided. -Otherwise check MyChart for any negative testing results. -Continue to monitor for any symptoms such as abnormal discharge, abnormal pelvic/abdominal pain, severe flank pain, genital lesions, intractable fever, nausea/vomiting, severe diarrhea. -If you experience any escalation of current symptoms follow-up in urgent care or an ER for further evaluation and management.

## 2024-08-24 NOTE — ED Provider Notes (Signed)
 [facility]  Note:  This document was prepared using Conservation officer, historic buildings and may include unintentional dictation errors.  MRN: [id number] DOB: Apr 17, 1986  Subjective:   Jason Walter is a 38 y.o. male presenting for penile discharge x 1 week.  Patient denies any known exposure to any STDs.  Patient denies any dysuria, abdominal pain, flank pain, penile lesion.  Patient denies taking any over-the-counter medication to treat symptoms.  Patient reports that the discharge is a thick white discharge.  No current facility-administered medications for this encounter.  Current Outpatient Medications:    amLODipine (NORVASC) 5 MG tablet, Take 5 mg by mouth daily., Disp: , Rfl:    ascorbic acid  (VITAMIN C) 500 MG tablet, Take 1 tablet (500 mg total) by mouth 2 (two) times daily., Disp: 60 tablet, Rfl: 0   cholecalciferol  (VITAMIN D ) 25 MCG tablet, Take 2 tablets (2,000 Units total) by mouth daily., Disp: 60 tablet, Rfl: 0   cyclobenzaprine  (FLEXERIL ) 10 MG tablet, Take 1 tablet (10 mg total) by mouth 2 (two) times daily as needed for muscle spasms., Disp: 20 tablet, Rfl: 0   WEGOVY 1 MG/0.5ML SOAJ SQ injection, Inject 1 mg into the skin once a week., Disp: , Rfl:    aspirin  EC 81 MG tablet, Take 81 mg by mouth daily. (Patient not taking: Reported on 11/04/2023), Disp: , Rfl:    betamethasone acetate-betamethasone sodium phosphate  (CELESTONE) 6 (3-3) MG/ML injection, Inject into the articular space. (Patient not taking: Reported on 11/04/2023), Disp: , Rfl:    cetirizine  (ZYRTEC  ALLERGY ) 10 MG tablet, Take 1 tablet (10 mg total) by mouth daily. (Patient not taking: Reported on 11/04/2023), Disp: 60 tablet, Rfl: 5   diphenhydrAMINE  (BENADRYL ) 25 mg capsule, Take 1 capsule (25 mg total) by mouth every 6 (six) hours as needed for itching (itching and swelling). (Patient not taking: Reported on 11/04/2023), Disp: 30 capsule, Rfl: 0   EPINEPHrine  0.3 mg/0.3 mL IJ SOAJ injection, Inject  0.3 mLs (0.3 mg total) into the muscle as needed., Disp: 1 each, Rfl: 2   famotidine  (PEPCID ) 20 MG tablet, Take 1 tablet (20 mg total) by mouth 2 (two) times daily. (Patient not taking: Reported on 11/04/2023), Disp: 60 tablet, Rfl: 5   gabapentin  (NEURONTIN ) 400 MG capsule, Take 1 capsule (400 mg total) by mouth 3 (three) times daily. (Patient not taking: Reported on 11/04/2023), Disp: 90 capsule, Rfl: 0   HYDROcodone -acetaminophen  (NORCO) 10-325 MG tablet, Take 1 tablet by mouth every 6 (six) hours as needed for moderate pain or severe pain. (Patient not taking: Reported on 11/04/2023), Disp: , Rfl:    lidocaine  (XYLOCAINE ) 1 % (with preservative) injection, by Infiltration route. (Patient not taking: Reported on 11/04/2023), Disp: , Rfl:    ondansetron  (ZOFRAN -ODT) 4 MG disintegrating tablet, Take 1 tablet (4 mg total) by mouth every 8 (eight) hours as needed for nausea or vomiting. (Patient not taking: Reported on 08/24/2024), Disp: 20 tablet, Rfl: 0   oseltamivir  (TAMIFLU ) 75 MG capsule, Take 1 capsule (75 mg total) by mouth every 12 (twelve) hours. (Patient not taking: Reported on 08/24/2024), Disp: 10 capsule, Rfl: 0   promethazine -dextromethorphan (PROMETHAZINE -DM) 6.25-15 MG/5ML syrup, Take 5 mLs by mouth 4 (four) times daily as needed for cough. (Patient not taking: Reported on 08/24/2024), Disp: 118 mL, Rfl: 0   Allergies  Allergen Reactions   Molybdenum     Positive patch test  Other Reaction(s): Other (See Comments)  Positive patch test  Positive patch test  Positive patch  test, Positive patch test   Nickel     Positive patch test  Other Reaction(s): Other (See Comments)  Positive patch test   Positive patch test   Positive patch test , Positive patch test    Past Medical History:  Diagnosis Date   Allergy     DVT (deep venous thrombosis) (HCC)    GERD (gastroesophageal reflux disease)    at times   Hypertension    PTSD (post-traumatic stress disorder)      Past  Surgical History:  Procedure Laterality Date   HARDWARE REMOVAL Right 12/10/2020   Procedure: HARDWARE REMOVAL;  Surgeon: Kendal Franky SQUIBB, MD;  Location: [facility] OR;  Service: Orthopedics;  Laterality: Right;   OPEN REDUCTION INTERNAL FIXATION ACETABULUM FRACTURE POSTERIOR Right 05/30/2020   Procedure: OPEN REDUCTION INTERNAL FIXATION ACETABULUM FRACTURE POSTERIOR;  Surgeon: Kendal Franky SQUIBB, MD;  Location: [facility];  Service: Orthopedics;  Laterality: Right;   ORIF ACETABULUM FRACTURE  05/30/2020   CPT 27228-Open reduction internal fixation of right transverse-posterior wall acetabular fracture   TRIGGER FINGER RELEASE Left 07/22/2021   Procedure: MINOR RELEASE TRIGGER FINGER LEFT LONG FINGER;  Surgeon: Shari Easter, MD;  Location: [facility];  Service: Orthopedics;  Laterality: Left;  with local anesthesia   WRIST SURGERY Left     Family History  Problem Relation Age of Onset   Diabetes Mother    Brain cancer Other     Social History   Tobacco Use   Smoking status: Former    Current packs/day: 0.00    Average packs/day: 0.1 packs/day for 2.0 years (0.2 ttl pk-yrs)    Types: Cigarettes    Start date: 05/30/2018    Quit date: 05/30/2020    Years since quitting: 4.2   Smokeless tobacco: Never  Vaping Use   Vaping status: Never Used  Substance Use Topics   Alcohol use: Not Currently    Comment: occasional   Drug use: No    ROS Refer to HPI for ROS details.  Objective:   Vitals: BP 137/79 (BP Location: Left Arm)   Pulse 68   Temp 98.3 F (36.8 C) (Oral)   Resp 16   SpO2 98%   Physical Exam Vitals and nursing note reviewed.  Constitutional:      General: He is not in acute distress.    Appearance: Normal appearance. He is well-developed. He is not ill-appearing or toxic-appearing.  HENT:     Head: Normocephalic.  Cardiovascular:     Rate and Rhythm: Normal rate.  Pulmonary:     Effort: Pulmonary effort is normal. No respiratory distress.  Abdominal:      Palpations: Abdomen is soft.     Tenderness: There is no abdominal tenderness. There is no right CVA tenderness or left CVA tenderness.  Skin:    General: Skin is warm and dry.  Neurological:     General: No focal deficit present.     Mental Status: He is alert and oriented to person, place, and time.  Psychiatric:        Mood and Affect: Mood normal.        Behavior: Behavior normal.     Procedures  No results found for this or any previous visit (from the past 24 hours).  No results found.   Assessment and Plan :     Discharge Instructions       1. Penile discharge (Primary) - All testing results will take 1 to 3 days for completion if there are  any abnormalities noted on the final report you will be contacted and appropriate guidance provided. -Otherwise check MyChart for any negative testing results. -Continue to monitor for any symptoms such as abnormal discharge, abnormal pelvic/abdominal pain, severe flank pain, genital lesions, intractable fever, nausea/vomiting, severe diarrhea. -If you experience any escalation of current symptoms follow-up in urgent care or an ER for further evaluation and management.        Addilyn Satterwhite B Nomi Rudnicki   Braysen Cloward, [place] B, [place] 08/24/24 1214

## 2024-08-27 ENCOUNTER — Ambulatory Visit (HOSPITAL_COMMUNITY): Payer: Self-pay

## 2024-08-27 LAB — CYTOLOGY, (ORAL, ANAL, URETHRAL) ANCILLARY ONLY
Chlamydia: NEGATIVE
Comment: NEGATIVE
Comment: NEGATIVE
Comment: NORMAL
Neisseria Gonorrhea: NEGATIVE
Trichomonas: POSITIVE — AB

## 2024-08-27 MED ORDER — METRONIDAZOLE 500 MG PO TABS: 2000.0000 mg | ORAL_TABLET | Freq: Once | ORAL | 0 refills | Status: AC
